# Patient Record
Sex: Female | Born: 1940 | Race: White | Hispanic: No | Smoking: Former smoker
Health system: Southern US, Community
[De-identification: ages and names within clinical notes are randomized; demographics above are authoritative.]

## PROBLEM LIST (undated history)

## (undated) DIAGNOSIS — T7840XA Allergy, unspecified, initial encounter: Secondary | ICD-10-CM

## (undated) DIAGNOSIS — H269 Unspecified cataract: Secondary | ICD-10-CM

## (undated) HISTORY — PX: ABDOMINAL HYSTERECTOMY: SHX81

## (undated) HISTORY — DX: Unspecified cataract: H26.9

## (undated) HISTORY — PX: APPENDECTOMY: SHX54

## (undated) HISTORY — PX: EYE SURGERY: SHX253

## (undated) HISTORY — PX: DILATION AND CURETTAGE OF UTERUS: SHX78

## (undated) HISTORY — DX: Allergy, unspecified, initial encounter: T78.40XA

---

## 1967-12-21 HISTORY — PX: OVARIAN CYST REMOVAL: SHX89

## 1983-12-21 HISTORY — PX: SIGMOIDOSCOPY: SUR1295

## 2003-07-02 LAB — HM PAP SMEAR: HM Pap smear: NEGATIVE

## 2005-12-28 ENCOUNTER — Ambulatory Visit: Payer: Self-pay | Admitting: Family Medicine

## 2007-01-11 ENCOUNTER — Ambulatory Visit: Payer: Self-pay | Admitting: Family Medicine

## 2007-11-29 ENCOUNTER — Encounter: Payer: Self-pay | Admitting: General Practice

## 2008-01-24 ENCOUNTER — Ambulatory Visit: Payer: Self-pay | Admitting: Family Medicine

## 2008-02-12 ENCOUNTER — Ambulatory Visit: Payer: Self-pay | Admitting: General Surgery

## 2008-07-02 ENCOUNTER — Other Ambulatory Visit: Payer: Self-pay

## 2008-07-02 ENCOUNTER — Emergency Department: Payer: Self-pay

## 2011-02-19 ENCOUNTER — Ambulatory Visit: Payer: Self-pay | Admitting: General Surgery

## 2011-02-19 LAB — HM COLONOSCOPY

## 2013-07-05 ENCOUNTER — Ambulatory Visit: Payer: Self-pay | Admitting: Family Medicine

## 2013-07-05 ENCOUNTER — Encounter: Payer: Self-pay | Admitting: *Deleted

## 2013-07-05 LAB — HM DEXA SCAN

## 2013-07-18 ENCOUNTER — Ambulatory Visit: Payer: Self-pay | Admitting: Ophthalmology

## 2013-09-12 ENCOUNTER — Ambulatory Visit: Payer: Self-pay | Admitting: Ophthalmology

## 2014-01-28 ENCOUNTER — Ambulatory Visit: Payer: Self-pay | Admitting: Unknown Physician Specialty

## 2014-08-30 LAB — CBC AND DIFFERENTIAL
HEMATOCRIT: 34 % — AB (ref 36–46)
HEMOGLOBIN: 11.3 g/dL — AB (ref 12.0–16.0)
PLATELETS: 278 10*3/uL (ref 150–399)
WBC: 7 10^3/mL

## 2014-08-30 LAB — BASIC METABOLIC PANEL
BUN: 16 mg/dL (ref 4–21)
Creatinine: 0.8 mg/dL (ref 0.5–1.1)
Glucose: 95 mg/dL
Potassium: 4.6 mmol/L (ref 3.4–5.3)
Sodium: 136 mmol/L — AB (ref 137–147)

## 2014-08-30 LAB — LIPID PANEL
Cholesterol: 242 mg/dL — AB (ref 0–200)
HDL: 72 mg/dL — AB (ref 35–70)
LDL CALC: 137 mg/dL
TRIGLYCERIDES: 164 mg/dL — AB (ref 40–160)

## 2014-08-30 LAB — HEPATIC FUNCTION PANEL
ALT: 8 U/L (ref 7–35)
AST: 12 U/L — AB (ref 13–35)

## 2014-08-30 LAB — TSH: TSH: 2.64 u[IU]/mL (ref 0.41–5.90)

## 2014-10-14 ENCOUNTER — Ambulatory Visit: Payer: Self-pay | Admitting: Family Medicine

## 2014-10-24 ENCOUNTER — Ambulatory Visit: Payer: Self-pay | Admitting: Family Medicine

## 2014-10-24 LAB — HM MAMMOGRAPHY

## 2014-11-04 ENCOUNTER — Ambulatory Visit: Payer: Self-pay | Admitting: Family Medicine

## 2014-11-04 HISTORY — PX: BREAST CYST ASPIRATION: SHX578

## 2014-11-04 HISTORY — PX: BREAST BIOPSY: SHX20

## 2015-04-14 LAB — SURGICAL PATHOLOGY

## 2015-08-19 ENCOUNTER — Other Ambulatory Visit: Payer: Self-pay | Admitting: Family Medicine

## 2015-08-19 ENCOUNTER — Telehealth: Payer: Self-pay | Admitting: Family Medicine

## 2015-08-19 DIAGNOSIS — Z780 Asymptomatic menopausal state: Secondary | ICD-10-CM | POA: Insufficient documentation

## 2015-08-19 NOTE — Telephone Encounter (Signed)
Pt stated that her thumb nail on her left hand has split and is tender & gray in color. Pt stated that it isn't red or swollen. Pt said that she has always had a line on her nail and she had acrylic put on top of her nail and when it was removed her nail had split 3/4 of the way down and had turned gray. Pt stated the nail tech said it was water trapped in between her nail & nail bed. Pt stated she doesn't want to come here an pay a co-pay if we are going to send her somewhere else. Pt wanted to know where she should go to have this treated. Please advise. Thanks TNP

## 2015-08-19 NOTE — Telephone Encounter (Signed)
Patient reports that she will call her dermatologist, pt reports that she does not need a referral. sd

## 2015-08-19 NOTE — Telephone Encounter (Signed)
Sounds like she would need a Dermatologist. Would be glad to refer to Dermatology to evaluate. Thanks.

## 2015-09-26 DIAGNOSIS — D649 Anemia, unspecified: Secondary | ICD-10-CM | POA: Insufficient documentation

## 2015-09-26 DIAGNOSIS — E559 Vitamin D deficiency, unspecified: Secondary | ICD-10-CM | POA: Insufficient documentation

## 2015-09-26 DIAGNOSIS — Z6825 Body mass index (BMI) 25.0-25.9, adult: Secondary | ICD-10-CM | POA: Insufficient documentation

## 2015-09-26 DIAGNOSIS — M8580 Other specified disorders of bone density and structure, unspecified site: Secondary | ICD-10-CM | POA: Insufficient documentation

## 2015-09-26 DIAGNOSIS — L719 Rosacea, unspecified: Secondary | ICD-10-CM | POA: Insufficient documentation

## 2015-09-26 DIAGNOSIS — E7800 Pure hypercholesterolemia, unspecified: Secondary | ICD-10-CM | POA: Insufficient documentation

## 2015-09-26 DIAGNOSIS — N951 Menopausal and female climacteric states: Secondary | ICD-10-CM | POA: Insufficient documentation

## 2015-09-26 DIAGNOSIS — K5790 Diverticulosis of intestine, part unspecified, without perforation or abscess without bleeding: Secondary | ICD-10-CM | POA: Insufficient documentation

## 2015-09-26 DIAGNOSIS — E663 Overweight: Secondary | ICD-10-CM | POA: Insufficient documentation

## 2015-09-26 DIAGNOSIS — J309 Allergic rhinitis, unspecified: Secondary | ICD-10-CM | POA: Insufficient documentation

## 2015-09-30 ENCOUNTER — Ambulatory Visit (INDEPENDENT_AMBULATORY_CARE_PROVIDER_SITE_OTHER): Payer: Medicare Other | Admitting: Family Medicine

## 2015-09-30 ENCOUNTER — Encounter: Payer: Self-pay | Admitting: Family Medicine

## 2015-09-30 VITALS — BP 114/80 | HR 84 | Temp 97.8°F | Resp 16 | Ht 65.0 in | Wt 146.0 lb

## 2015-09-30 DIAGNOSIS — M810 Age-related osteoporosis without current pathological fracture: Secondary | ICD-10-CM | POA: Diagnosis not present

## 2015-09-30 DIAGNOSIS — Z1239 Encounter for other screening for malignant neoplasm of breast: Secondary | ICD-10-CM

## 2015-09-30 DIAGNOSIS — Z0000 Encounter for general adult medical examination without abnormal findings: Secondary | ICD-10-CM | POA: Diagnosis not present

## 2015-09-30 DIAGNOSIS — E7800 Pure hypercholesterolemia, unspecified: Secondary | ICD-10-CM | POA: Diagnosis not present

## 2015-09-30 DIAGNOSIS — N951 Menopausal and female climacteric states: Secondary | ICD-10-CM

## 2015-09-30 DIAGNOSIS — D508 Other iron deficiency anemias: Secondary | ICD-10-CM

## 2015-09-30 DIAGNOSIS — Z780 Asymptomatic menopausal state: Secondary | ICD-10-CM

## 2015-09-30 MED ORDER — ESTRADIOL 0.5 MG PO TABS: 0.5000 mg | ORAL_TABLET | Freq: Every day | ORAL | Status: DC

## 2015-09-30 NOTE — Patient Instructions (Signed)
Please call the [facility] at [facility] to schedule this at [phone]   

## 2015-09-30 NOTE — Progress Notes (Signed)
Patient ID: Carol Kelly, female   DOB: 1941-11-26, 74 y.o.   MRN: [id number]        Patient: Carol Kelly, Female    DOB: October 04, 1941, 74 y.o.   MRN: [id number] Visit Date: 09/30/2015  Today's Provider: Lorie Phenix, MD   Chief Complaint  Patient presents with  . Medicare Wellness   Subjective:    Annual wellness visit Carol Kelly is a 74 y.o. female. She feels well. She reports exercising none. She reports she is sleeping fairly well. Has no concerns other than her thumb,  having biopsy at the end of next week.    08/30/14 CPE 07/02/03 Pap-neg 10/24/14 Mammo-BI-RADS 2 02/19/11 Colon-Diverticulosis 07/05/13 BMD-osteopenia  Lab Results  Component Value Date   WBC 7.0 08/30/2014   HGB 11.3* 08/30/2014   HCT 34* 08/30/2014   PLT 278 08/30/2014   CHOL 242* 08/30/2014   TRIG 164* 08/30/2014   HDL 72* 08/30/2014   LDLCALC 137 08/30/2014   ALT 8 08/30/2014   AST 12* 08/30/2014   NA 136* 08/30/2014   K 4.6 08/30/2014   CREATININE 0.8 08/30/2014   BUN 16 08/30/2014   TSH 2.64 08/30/2014    -----------------------------------------------------------   Review of Systems  Constitutional: Negative.   HENT: Negative.   Eyes: Negative.   Respiratory: Negative.   Cardiovascular: Negative.   Gastrointestinal: Negative.   Endocrine: Negative.   Genitourinary: Negative.   Musculoskeletal: Negative.   Skin: Negative.   Allergic/Immunologic: Negative.   Neurological: Negative.   Hematological: Negative.   Psychiatric/Behavioral: Negative.     Social History   Social History  . Marital Status: Single    Spouse Name: N/A  . Number of Children: N/A  . Years of Education: N/A   Occupational History  . Not on file.   Social History Main Topics  . Smoking status: Former Games developer  . Smokeless tobacco: Never Used  . Alcohol Use: Yes     Comment: occasional  . Drug Use: No  . Sexual Activity: Not on file   Other Topics Concern  . Not on file    Social History Narrative    Patient Active Problem List   Diagnosis Date Noted  . Allergic rhinitis 09/26/2015  . Absolute anemia 09/26/2015  . Body mass index (BMI) of 25.0-25.9 in adult 09/26/2015  . DD (diverticular disease) 09/26/2015  . Hot flash, menopausal 09/26/2015  . Hypercholesteremia 09/26/2015  . OP (osteoporosis) 09/26/2015  . Acne erythematosa 09/26/2015  . Avitaminosis D 09/26/2015  . Post-menopausal 08/19/2015    Past Surgical History  Procedure Laterality Date  . Ovarian cyst removal  1969  . Sigmoidoscopy  1985  . Abdominal hysterectomy    . Appendectomy    . Dilation and curettage of uterus    . Eye surgery Bilateral  left9/24/14    Cataract removed Right 07/18/2013    Her family history includes Brain cancer in her brother; Colon polyps in her father; Dementia in her father; Diabetes in her mother; Heart disease in her brother and father.    Previous Medications   AZELASTINE HCL 0.15 % SOLN       ESTRADIOL (ESTRACE) 0.5 MG TABLET    TAKE ONE TABLET BY MOUTH DAILY   MULTIPLE VITAMINS-MINERALS PO    Take 1 tablet by mouth daily.    Patient Care Team: Lorie Phenix, MD as PCP - General (Family Medicine)     Objective:   Vitals: BP 114/80 mmHg  Pulse 84  Temp(Src) 97.8  F (36.6 C) (Oral)  Resp 16  Ht  (1.651 m)  Wt 146 lb (66.225 kg)  BMI 24.30 kg/m2  SpO2 98%  Physical Exam  Constitutional: She is oriented to person, place, and time. She appears well-developed and well-nourished.  HENT:  Head: Normocephalic and atraumatic.  Right Ear: Tympanic membrane, external ear and ear canal normal.  Left Ear: Tympanic membrane, external ear and ear canal normal.  Nose: Nose normal.  Mouth/Throat: Uvula is midline, oropharynx is clear and moist and mucous membranes are normal.  Eyes: Conjunctivae, EOM and lids are normal. Pupils are equal, round, and reactive to light.  Neck: Trachea normal and normal range of motion. Neck supple. Carotid  bruit is not present. No thyroid mass and no thyromegaly present.  Cardiovascular: Normal rate, regular rhythm and normal heart sounds.   Pulmonary/Chest: Effort normal and breath sounds normal.  Abdominal: Soft. Normal appearance and bowel sounds are normal. There is no hepatosplenomegaly. There is no tenderness.  Genitourinary: No breast swelling, tenderness or discharge.  Musculoskeletal: Normal range of motion.  Lymphadenopathy:    She has no cervical adenopathy.    She has no axillary adenopathy.  Neurological: She is alert and oriented to person, place, and time. She has normal strength. No cranial nerve deficit.  Skin: Skin is warm, dry and intact.  Psychiatric: She has a normal mood and affect. Her speech is normal and behavior is normal. Judgment and thought content normal. Cognition and memory are normal.    Activities of Daily Living In your present state of health, do you have any difficulty performing the following activities: 09/30/2015  Hearing? N  Vision? N  Difficulty concentrating or making decisions? N  Walking or climbing stairs? N  Dressing or bathing? N  Doing errands, shopping? N    Fall Risk Assessment Fall Risk  09/30/2015  Falls in the past year? No     Depression Screen PHQ 2/9 Scores 09/30/2015  PHQ - 2 Score 0    Cognitive Testing - 6-CIT  Correct? Score   What year is it? yes 0 0 or 4  What month is it? yes 0 0 or 3  Memorize:    Floyde Parkins,  43,  [address]  [place],      What time is it? (within 1 hour) yes 0 0 or 3  Count backwards from 20 yes 0 0, 2, or 4  Name the months of the year yes 0 0, 2, or 4  Repeat name & address above yes 0 0, 2, 4, 6, 8, or 10       TOTAL SCORE  0/28   Interpretation:  Normal  Normal (0-7) Abnormal (8-28)       Assessment & Plan:     Annual Wellness Visit  Reviewed patient's Family Medical History Reviewed and updated list of patient's medical providers Assessment of cognitive impairment was  done Assessed patient's functional ability Established a written schedule for health screening services Health Risk Assessent Completed and Reviewed  Exercise Activities and Dietary recommendations Goals    . Exercise 150 minutes per week (moderate activity)       Immunization History  Administered Date(s) Administered  . Pneumococcal Conjugate-13 08/30/2014  . Pneumococcal Polysaccharide-23 06/27/2014  . Zoster 10/27/2011    Health Maintenance  Topic Date Due  . Janet Berlin  08/26/1961  . INFLUENZA VACCINE  07/21/2015  . MAMMOGRAM  10/24/2016  . COLONOSCOPY  02/18/2021  . DEXA SCAN  Completed  .  ZOSTAVAX  Completed  . PNA vac Low Risk Adult  Completed      1. Medicare annual wellness visit, subsequent Stable. Patient advised to continue eating healthy and exercise daily.  2. Breast cancer screening - MM DIGITAL SCREENING BILATERAL; Future  3. Hypercholesteremia - Comprehensive metabolic panel - Lipid Panel With LDL/HDL Ratio  4. Other iron deficiency anemias - CBC with Differential/Platelet  5. OP (osteoporosis) - TSH - DG Bone Density; Future     Patient seen and examined by Dr. Leo Grosser, and note scribed by Liz Beach. Dimas, CMA.  I have reviewed the document for accuracy and completeness and I agree with above. Leo Grosser, MD   Lorie Phenix, MD       ------------------------------------------------------------------------------------------------------------

## 2015-10-01 LAB — COMPREHENSIVE METABOLIC PANEL
A/G RATIO: 1.7 (ref 1.1–2.5)
ALK PHOS: 77 IU/L (ref 39–117)
ALT: 7 IU/L (ref 0–32)
AST: 17 IU/L (ref 0–40)
Albumin: 4.5 g/dL (ref 3.5–4.8)
BILIRUBIN TOTAL: 0.4 mg/dL (ref 0.0–1.2)
BUN/Creatinine Ratio: 18 (ref 11–26)
BUN: 14 mg/dL (ref 8–27)
CO2: 22 mmol/L (ref 18–29)
Calcium: 10 mg/dL (ref 8.7–10.3)
Chloride: 100 mmol/L (ref 97–108)
Creatinine, Ser: 0.78 mg/dL (ref 0.57–1.00)
GFR calc non Af Amer: 76 mL/min/{1.73_m2} (ref >59)
GFR, EST AFRICAN AMERICAN: 87 mL/min/{1.73_m2} (ref >59)
GLUCOSE: 108 mg/dL — AB (ref 65–99)
Globulin, Total: 2.6 g/dL (ref 1.5–4.5)
POTASSIUM: 5.7 mmol/L — AB (ref 3.5–5.2)
SODIUM: 142 mmol/L (ref 134–144)
Total Protein: 7.1 g/dL (ref 6.0–8.5)

## 2015-10-01 LAB — CBC WITH DIFFERENTIAL/PLATELET
BASOS ABS: 0.1 10*3/uL (ref 0.0–0.2)
BASOS: 2 %
EOS (ABSOLUTE): 0.8 10*3/uL — AB (ref 0.0–0.4)
Eos: 12 %
Hematocrit: 36.7 % (ref 34.0–46.6)
Hemoglobin: 12.2 g/dL (ref 11.1–15.9)
Immature Grans (Abs): 0 10*3/uL (ref 0.0–0.1)
Immature Granulocytes: 0 %
Lymphocytes Absolute: 2.6 10*3/uL (ref 0.7–3.1)
Lymphs: 39 %
MCH: 31.9 pg (ref 26.6–33.0)
MCHC: 33.2 g/dL (ref 31.5–35.7)
MCV: 96 fL (ref 79–97)
MONOS ABS: 0.5 10*3/uL (ref 0.1–0.9)
Monocytes: 8 %
NEUTROS ABS: 2.5 10*3/uL (ref 1.4–7.0)
NEUTROS PCT: 39 %
PLATELETS: 260 10*3/uL (ref 150–379)
RBC: 3.82 x10E6/uL (ref 3.77–5.28)
RDW: 12.8 % (ref 12.3–15.4)
WBC: 6.4 10*3/uL (ref 3.4–10.8)

## 2015-10-01 LAB — LIPID PANEL WITH LDL/HDL RATIO
Cholesterol, Total: 248 mg/dL — ABNORMAL HIGH (ref 100–199)
HDL: 79 mg/dL (ref >39)
LDL CALC: 136 mg/dL — AB (ref 0–99)
LDL/HDL RATIO: 1.7 ratio (ref 0.0–3.2)
Triglycerides: 164 mg/dL — ABNORMAL HIGH (ref 0–149)
VLDL Cholesterol Cal: 33 mg/dL (ref 5–40)

## 2015-10-01 LAB — TSH: TSH: 3.2 u[IU]/mL (ref 0.450–4.500)

## 2015-10-02 ENCOUNTER — Telehealth: Payer: Self-pay

## 2015-10-02 DIAGNOSIS — E875 Hyperkalemia: Secondary | ICD-10-CM

## 2015-10-02 NOTE — Telephone Encounter (Signed)
Pt advised; she does not want to start a statin right now.  She will continue to work on lifestyle changes.  Also pt did not get her labs checked downstairs; she went to a different [facility].  But she agreed to get her potassium recheck one day late next week.  Will leave a sheet at the front desk.    Thanks,   -Vernona RiegerLaura

## 2015-10-02 NOTE — Telephone Encounter (Signed)
-----  Message from Margarita Rana, MD sent at 10/01/2015  2:03 PM EDT ----- Cholesterol about the same as last year. Can take a statin or continue lifestyle, know that patient did not want a medication last year and labs have not really changed. 10 year risk of cardiac event is 13 percent, but risk just based on age is 55 percent, so not sure of benefit of medication. Also, potassium is elevated as is sure.  Unclear if lab error.  Have been having trouble with this.   Recommend recheck at different lab. Print out met b lab slip and make sure to right on it, no charge per Gordy Clement and give copy to Judson Roch. Thanks.

## 2015-10-06 ENCOUNTER — Telehealth: Payer: Self-pay | Admitting: Family Medicine

## 2015-10-06 DIAGNOSIS — N6310 Unspecified lump in the right breast, unspecified quadrant: Secondary | ICD-10-CM

## 2015-10-06 NOTE — Telephone Encounter (Signed)
[facility] due for f/u of right breast mass and also yearly [facility] and limited ultrasound of right/left breast

## 2015-10-06 NOTE — Telephone Encounter (Signed)
Ok to order. Thanks.   

## 2015-10-15 ENCOUNTER — Ambulatory Visit: Payer: Self-pay

## 2015-10-15 ENCOUNTER — Telehealth: Payer: Self-pay

## 2015-10-15 LAB — BASIC METABOLIC PANEL
BUN/Creatinine Ratio: 16 (ref 11–26)
BUN: 12 mg/dL (ref 8–27)
CALCIUM: 9.4 mg/dL (ref 8.7–10.3)
CHLORIDE: 98 mmol/L (ref 97–106)
CO2: 27 mmol/L (ref 18–29)
Creatinine, Ser: 0.75 mg/dL (ref 0.57–1.00)
GFR, EST AFRICAN AMERICAN: 91 mL/min/{1.73_m2} (ref >59)
GFR, EST NON AFRICAN AMERICAN: 79 mL/min/{1.73_m2} (ref >59)
Glucose: 100 mg/dL — ABNORMAL HIGH (ref 65–99)
POTASSIUM: 4.2 mmol/L (ref 3.5–5.2)
SODIUM: 138 mmol/L (ref 136–144)

## 2015-10-15 NOTE — Telephone Encounter (Signed)
Pt advised.   Thanks,   -Laura  

## 2015-10-15 NOTE — Telephone Encounter (Signed)
-----   Message from Lorie PhenixNancy Maloney, MD sent at 10/15/2015 10:05 AM EDT ----- Normal labs.  Please notify patient. Thanks.

## 2015-10-16 ENCOUNTER — Telehealth: Payer: Self-pay

## 2015-10-16 NOTE — Telephone Encounter (Signed)
-----   Message from Lorie PhenixNancy Maloney, MD sent at 10/15/2015  4:42 PM EDT ----- Labs stable. Please notify patient. Thanks.

## 2015-10-16 NOTE — Telephone Encounter (Signed)
Pt advised.   Thanks,   -Shya Kovatch  

## 2015-10-20 ENCOUNTER — Ambulatory Visit: Admission: RE | Admit: 2015-10-20 | Payer: Medicare Other | Source: Ambulatory Visit

## 2015-10-20 ENCOUNTER — Ambulatory Visit
Admission: RE | Admit: 2015-10-20 | Discharge: 2015-10-20 | Disposition: A | Discharge status: Home / Self Care | Payer: Medicare Other | Source: Ambulatory Visit | Attending: Family Medicine | Admitting: Family Medicine

## 2015-10-20 DIAGNOSIS — M8580 Other specified disorders of bone density and structure, unspecified site: Secondary | ICD-10-CM | POA: Insufficient documentation

## 2015-10-20 DIAGNOSIS — M810 Age-related osteoporosis without current pathological fracture: Secondary | ICD-10-CM

## 2015-10-20 DIAGNOSIS — Z1239 Encounter for other screening for malignant neoplasm of breast: Secondary | ICD-10-CM

## 2015-10-20 DIAGNOSIS — R928 Other abnormal and inconclusive findings on diagnostic imaging of breast: Secondary | ICD-10-CM | POA: Diagnosis present

## 2015-10-20 DIAGNOSIS — Z13820 Encounter for screening for osteoporosis: Secondary | ICD-10-CM | POA: Diagnosis not present

## 2015-11-03 ENCOUNTER — Telehealth: Payer: Self-pay

## 2015-11-03 NOTE — Telephone Encounter (Signed)
Pt declines treatment. Allene DillonEmily Drozdowski, CMA

## 2015-11-03 NOTE — Telephone Encounter (Signed)
-----   Message from Lorie PhenixNancy Maloney, MD sent at 11/02/2015  2:59 PM EST ----- Still with osteoporosis. Not changed from 2014. 10 year risk of hip fracture is 9.4 percent and any fracture is 21 percent. Please see if patient would like to come in and discuss medical management. Thanks- Dr. Judie PetitM.

## 2016-02-23 ENCOUNTER — Ambulatory Visit: Payer: Self-pay | Admitting: General Surgery

## 2016-02-25 ENCOUNTER — Ambulatory Visit: Payer: Self-pay | Admitting: General Surgery

## 2016-03-02 ENCOUNTER — Ambulatory Visit: Payer: Self-pay | Admitting: General Surgery

## 2016-05-26 ENCOUNTER — Other Ambulatory Visit: Payer: Self-pay

## 2016-05-26 DIAGNOSIS — Z780 Asymptomatic menopausal state: Secondary | ICD-10-CM

## 2016-05-26 MED ORDER — ESTRADIOL 0.5 MG PO TABS: 0.5000 mg | ORAL_TABLET | Freq: Every day | ORAL | Status: DC

## 2016-07-14 ENCOUNTER — Encounter: Payer: Self-pay | Admitting: *Deleted

## 2016-08-31 ENCOUNTER — Encounter: Payer: Self-pay | Admitting: *Deleted

## 2016-09-06 ENCOUNTER — Ambulatory Visit: Payer: Self-pay | Admitting: General Surgery

## 2016-10-01 ENCOUNTER — Encounter: Payer: Self-pay | Admitting: Physician Assistant

## 2016-10-01 ENCOUNTER — Ambulatory Visit (INDEPENDENT_AMBULATORY_CARE_PROVIDER_SITE_OTHER): Payer: Medicare Other | Admitting: Physician Assistant

## 2016-10-01 VITALS — BP 124/58 | HR 80 | Temp 97.5°F | Ht 64.75 in | Wt 159.1 lb

## 2016-10-01 DIAGNOSIS — E7800 Pure hypercholesterolemia, unspecified: Secondary | ICD-10-CM

## 2016-10-01 DIAGNOSIS — Z0000 Encounter for general adult medical examination without abnormal findings: Secondary | ICD-10-CM

## 2016-10-01 DIAGNOSIS — D649 Anemia, unspecified: Secondary | ICD-10-CM

## 2016-10-01 DIAGNOSIS — Z1239 Encounter for other screening for malignant neoplasm of breast: Secondary | ICD-10-CM

## 2016-10-01 DIAGNOSIS — Z1211 Encounter for screening for malignant neoplasm of colon: Secondary | ICD-10-CM

## 2016-10-01 NOTE — Progress Notes (Signed)
HPI: Carol Kelly is a 75 year old female that comes in today for her subsequent annual at a care wellness and complete physical exam. Annual wellness visit was completed and is documented under clinical support no from today.  Patient states that she is doing well and has no complaints today. She is excited because she is planning to go on a ten-day beach trip with 7 other females.  Review of Systems  Constitutional: Negative.   HENT: Negative.   Eyes: Positive for photophobia (chronic).  Respiratory: Negative.   Cardiovascular: Negative.   Gastrointestinal: Negative.   Endocrine: Negative.   Genitourinary: Negative.   Musculoskeletal: Negative.   Skin: Negative.   Allergic/Immunologic: Negative.   Neurological: Negative.   Hematological: Negative.   Psychiatric/Behavioral: Negative.    BP: 124/58 P:80 T:97.5 Ht: 5'4" Wt: 159 pounds BMI: 26.68 kg/m2  Physical Exam  Constitutional: She is oriented to person, place, and time. She appears well-developed and well-nourished. No distress.  HENT:  Head: Normocephalic and atraumatic.  Right Ear: Hearing, tympanic membrane, external ear and ear canal normal.  Left Ear: Hearing, tympanic membrane, external ear and ear canal normal.  Nose: Nose normal.  Mouth/Throat: Uvula is midline, oropharynx is clear and moist and mucous membranes are normal. No oropharyngeal exudate.  Eyes: Conjunctivae and EOM are normal. Pupils are equal, round, and reactive to light. Right eye exhibits no discharge. Left eye exhibits no discharge. No scleral icterus.  Neck: Normal range of motion. Neck supple. No JVD present. Carotid bruit is not present. No tracheal deviation present. No thyromegaly present.  Cardiovascular: Normal rate, regular rhythm, normal heart sounds and intact distal pulses.  Exam reveals no gallop and no friction rub.   No murmur heard. Pulmonary/Chest: Effort normal and breath sounds normal. No respiratory distress. She has no wheezes.  She has no rales. She exhibits no tenderness.  Abdominal: Soft. Bowel sounds are normal. She exhibits no distension and no mass. There is no tenderness. There is no rebound and no guarding.  Musculoskeletal: Normal range of motion. She exhibits no edema or tenderness.  Lymphadenopathy:    She has no cervical adenopathy.  Neurological: She is alert and oriented to person, place, and time.  Skin: Skin is warm and dry. No rash noted. She is not diaphoretic.  Psychiatric: She has a normal mood and affect. Her behavior is normal. Judgment and thought content normal.  Vitals reviewed.  Assessment   1. Annual physical exam   2. Colon cancer screening   3. Breast cancer screening   4. Hypercholesteremia   5. Anemia, unspecified type    Plan  1. Annual physical exam Normal physical exam today.  2. Colon cancer screening Patient prefers to cologuard over colonoscopy. Will order cologuard as below and follow-up pending results. - Cologuard  3. Breast cancer screening Breast exam today was normal. There is no family history of breast cancer. She does perform regular self breast exams. Mammogram was ordered as below. Information for [facility] was given to patient so she may schedule her mammogram at her convenience. - MM Digital Screening; Future  4. Hypercholesteremia  patient has a history of high cholesterol but is willing to start any cholesterol-lowering medication due to potential side effects. Cholesterol over the last few years has been stable. She is continuing lifestyle modifications including diet and exercise. I will check labs as below and follow-up pending lab results. - Lipid Profile - Comprehensive Metabolic Panel (CMET)  5. Anemia, unspecified type Stable. Will check labs  as below and f/u pending results. - CBC w/Diff/Platelet - Comprehensive Metabolic Panel (CMET) - TSH

## 2016-10-01 NOTE — Progress Notes (Signed)
Subjective:   Carol Kelly is a 75 y.o. female who presents for Medicare Annual (Subsequent) preventive examination.  Review of Systems:  N/A Cardiac Risk Factors include: advanced age (>67men, >35 women);dyslipidemia     Objective:     Vitals: BP (!) 124/58   Pulse 80   Temp 97.5 F (36.4 C)   Ht 5' 4.75" (1.645 m)   Wt 159 lb 2 oz (72.2 kg)   BMI 26.68 kg/m   Body mass index is 26.68 kg/m.   Tobacco History  Smoking Status  . Former Smoker  Smokeless Tobacco  . Never Used     Counseling given: No   Past Medical History:  Diagnosis Date  . Allergy    Past Surgical History:  Procedure Laterality Date  . ABDOMINAL HYSTERECTOMY    . APPENDECTOMY    . BREAST BIOPSY Right 2015   neg  . BREAST CYST ASPIRATION Right neg   2015  . DILATION AND CURETTAGE OF UTERUS    . EYE SURGERY Bilateral  left9/24/14   Cataract removed Right 07/18/2013  . OVARIAN CYST REMOVAL  1969  . SIGMOIDOSCOPY  1985   Family History  Problem Relation Age of Onset  . Colon polyps Father   . Heart disease Father     CVD  . Dementia Father   . Diabetes Mother   . Heart disease Brother   . Brain cancer Brother    History  Sexual Activity  . Sexual activity: Yes    Outpatient Encounter Prescriptions as of 10/01/2016  Medication Sig  . Calcium Carb-Cholecalciferol (CALCIUM 1000 + D) 1000-800 MG-UNIT TABS Take by mouth.  . cetirizine (ZYRTEC) 10 MG tablet Take 10 mg by mouth daily.  . cycloSPORINE (RESTASIS) 0.05 % ophthalmic emulsion Place 1 drop into both eyes 2 (two) times daily.  Marland Kitchen estradiol (ESTRACE) 0.5 MG tablet Take 1 tablet (0.5 mg total) by mouth daily. (Patient taking differently: Take 0.15 mg by mouth daily. )  . Multiple Vitamins-Calcium (VIACTIV MULTI-VITAMIN) CHEW Chew by mouth daily.  . Multiple Vitamins-Minerals (PRESERVISION AREDS 2 PO) Take by mouth.  . Azelastine HCl 0.15 % SOLN   . MULTIPLE VITAMINS-MINERALS PO Take 1 tablet by mouth daily.   No  facility-administered encounter medications on file as of 10/01/2016.     Activities of Daily Living In your present state of health, do you have any difficulty performing the following activities: 10/01/2016  Hearing? N  Vision? N  Difficulty concentrating or making decisions? N  Walking or climbing stairs? N  Dressing or bathing? N  Doing errands, shopping? N  Preparing Food and eating ? N  Using the Toilet? N  In the past six months, have you accidently leaked urine? Y  Do you have problems with loss of bowel control? N  Managing your Medications? N  Managing your Finances? N  Housekeeping or managing your Housekeeping? N  Some recent data might be hidden    Patient Care Team: Margaretann Loveless, PA-C as PCP - General (Family Medicine)    Assessment:      Exercise Activities and Dietary recommendations Current Exercise Habits: Home exercise routine, Type of exercise: stretching;strength training/weights, Time (Minutes): 60, Frequency (Times/Week): 5, Weekly Exercise (Minutes/Week): 300, Intensity: Mild, Exercise limited by: None identified  Goals    . Exercise 150 minutes per week (moderate activity)    . Reduce portion size          Starting 10/01/16, I will cut back on my  portion sizes and fatty foods.      Fall Risk Fall Risk  10/01/2016 09/30/2015  Falls in the past year? No No   Depression Screen PHQ 2/9 Scores 10/01/2016 09/30/2015  PHQ - 2 Score 0 0     Cognitive Testing No flowsheet data found.   6CIT Screen 10/01/2016  What Year? 0 points  What month? 0 points  What time? 0 points  Count back from 20 0 points  Months in reverse 0 points  Repeat phrase 0 points  Total Score 0     Immunization History  Administered Date(s) Administered  . Influenza-Unspecified 09/15/2016  . Pneumococcal Conjugate-13 08/30/2014  . Pneumococcal Polysaccharide-23 06/27/2014  . Zoster 10/27/2011   Screening Tests Health Maintenance  Topic Date Due  .  TETANUS/TDAP  12/29/2021 (Originally 08/26/1960)  . MAMMOGRAM  10/19/2017  . COLONOSCOPY  02/18/2021  . INFLUENZA VACCINE  Completed  . DEXA SCAN  Completed  . ZOSTAVAX  Completed  . PNA vac Low Risk Adult  Completed      Plan:    I have personally reviewed and addressed the Medicare Annual Wellness questionnaire and have noted the following in the patient's chart:  A. Medical and social history B. Use of alcohol, tobacco or illicit drugs  C. Current medications and supplements D. Functional ability and status E.  Nutritional status F.  Physical activity G. Advance directives H. List of other physicians I.  Hospitalizations, surgeries, and ER visits in previous 12 months J.  Vitals K. Screenings such as hearing and vision if needed, cognitive and depression L. Referrals and appointments - none  In addition, I have reviewed and discussed with patient certain preventive protocols, quality metrics, and best practice recommendations. A written personalized care plan for preventive services as well as general preventive health recommendations were provided to patient.  See attached scanned questionnaire for additional information.   Signed,  Hyacinth MeekerMckenzie Wilmarie Sparlin, LPN Nurse Health Advisor

## 2016-10-01 NOTE — Patient Instructions (Signed)

## 2016-10-01 NOTE — Patient Instructions (Signed)
Carol Kelly , Thank you for taking time to come for your Medicare Wellness Visit. I appreciate your ongoing commitment to your health goals. Please review the following plan we discussed and let me know if I can assist you in the future.   These are the goals we discussed: Goals    . Exercise 150 minutes per week (moderate activity)    . Reduce portion size          Starting 10/01/16, I will cut back on my portion sizes and fatty foods.       This is a list of the screening recommended for you and due dates:  Health Maintenance  Topic Date Due  . Tetanus Vaccine  12/29/2021*  . Mammogram  10/19/2017  . Colon Cancer Screening  02/18/2021  . Flu Shot  Completed  . DEXA scan (bone density measurement)  Completed  . Shingles Vaccine  Completed  . Pneumonia vaccines  Completed  *Topic was postponed. The date shown is not the original due date.   Preventive Care for Adults  A healthy lifestyle and preventive care can promote health and wellness. Preventive health guidelines for adults include the following key practices.  . A routine yearly physical is a good way to check with your health care provider about your health and preventive screening. It is a chance to share any concerns and updates on your health and to receive a thorough exam.  . Visit your dentist for a routine exam and preventive care every 6 months. Brush your teeth twice a day and floss once a day. Good oral hygiene prevents tooth decay and gum disease.  . The frequency of eye exams is based on your age, health, family medical history, use  of contact lenses, and other factors. Follow your health care provider's ecommendations for frequency of eye exams.  . Eat a healthy diet. Foods like vegetables, fruits, whole grains, low-fat dairy products, and lean protein foods contain the nutrients you need without too many calories. Decrease your intake of foods high in solid fats, added sugars, and salt. Eat the right  amount of calories for you. Get information about a proper diet from your health care provider, if necessary.  . Regular physical exercise is one of the most important things you can do for your health. Most adults should get at least 150 minutes of moderate-intensity exercise (any activity that increases your heart rate and causes you to sweat) each week. In addition, most adults need muscle-strengthening exercises on 2 or more days a week.  Silver Sneakers may be a benefit available to you. To determine eligibility, you may visit the website: www.silversneakers.com or contact program at [phone] Mon-Fri between 8AM-8PM.   . Maintain a healthy weight. The body mass index (BMI) is a screening tool to identify possible weight problems. It provides an estimate of body fat based on height and weight. Your health care provider can find your BMI and can help you achieve or maintain a healthy weight.   For adults 20 years and older: ? A BMI below 18.5 is considered underweight. ? A BMI of 18.5 to 24.9 is normal. ? A BMI of 25 to 29.9 is considered overweight. ? A BMI of 30 and above is considered obese.   . Maintain normal blood lipids and cholesterol levels by exercising and minimizing your intake of saturated fat. Eat a balanced diet with plenty of fruit and vegetables. Blood tests for lipids and cholesterol should begin at age 74 and  be repeated every 5 years. If your lipid or cholesterol levels are high, you are over 50, or you are at high risk for heart disease, you may need your cholesterol levels checked more frequently. Ongoing high lipid and cholesterol levels should be treated with medicines if diet and exercise are not working.  . If you smoke, find out from your health care provider how to quit. If you do not use tobacco, please do not start.  . If you choose to drink alcohol, please do not consume more than 2 drinks per day. One drink is considered to be 12 ounces (355 mL) of beer, 5  ounces (148 mL) of wine, or 1.5 ounces (44 mL) of liquor.  . If you are 39-53 years old, ask your health care provider if you should take aspirin to prevent strokes.  . Use sunscreen. Apply sunscreen liberally and repeatedly throughout the day. You should seek shade when your shadow is shorter than you. Protect yourself by wearing long sleeves, pants, a wide-brimmed hat, and sunglasses year round, whenever you are outdoors.  . Once a month, do a whole body skin exam, using a mirror to look at the skin on your back. Tell your health care provider of new moles, moles that have irregular borders, moles that are larger than a pencil eraser, or moles that have changed in shape or color.

## 2016-10-05 ENCOUNTER — Telehealth: Payer: Self-pay | Admitting: Physician Assistant

## 2016-10-05 NOTE — Telephone Encounter (Signed)
Order for cologuard faxed to [facility] °

## 2016-10-12 LAB — COLOGUARD

## 2016-10-13 ENCOUNTER — Telehealth: Payer: Self-pay

## 2016-10-13 LAB — TSH: TSH: 4.13 u[IU]/mL (ref 0.450–4.500)

## 2016-10-13 LAB — CBC WITH DIFFERENTIAL/PLATELET
BASOS ABS: 0.1 10*3/uL (ref 0.0–0.2)
Basos: 1 %
EOS (ABSOLUTE): 0.7 10*3/uL — ABNORMAL HIGH (ref 0.0–0.4)
Eos: 9 %
Hematocrit: 36 % (ref 34.0–46.6)
Hemoglobin: 11.6 g/dL (ref 11.1–15.9)
IMMATURE GRANULOCYTES: 0 %
Immature Grans (Abs): 0 10*3/uL (ref 0.0–0.1)
LYMPHS ABS: 2.6 10*3/uL (ref 0.7–3.1)
Lymphs: 36 %
MCH: 31.2 pg (ref 26.6–33.0)
MCHC: 32.2 g/dL (ref 31.5–35.7)
MCV: 97 fL (ref 79–97)
MONOCYTES: 10 %
MONOS ABS: 0.7 10*3/uL (ref 0.1–0.9)
NEUTROS PCT: 44 %
Neutrophils Absolute: 3.1 10*3/uL (ref 1.4–7.0)
Platelets: 308 10*3/uL (ref 150–379)
RBC: 3.72 x10E6/uL — AB (ref 3.77–5.28)
RDW: 13.6 % (ref 12.3–15.4)
WBC: 7.1 10*3/uL (ref 3.4–10.8)

## 2016-10-13 LAB — COMPREHENSIVE METABOLIC PANEL
A/G RATIO: 1.6 (ref 1.2–2.2)
ALBUMIN: 4.3 g/dL (ref 3.5–4.8)
ALT: 10 IU/L (ref 0–32)
AST: 14 IU/L (ref 0–40)
Alkaline Phosphatase: 81 IU/L (ref 39–117)
BUN / CREAT RATIO: 18 (ref 12–28)
BUN: 15 mg/dL (ref 8–27)
Bilirubin Total: 0.4 mg/dL (ref 0.0–1.2)
CALCIUM: 9.9 mg/dL (ref 8.7–10.3)
CO2: 29 mmol/L (ref 18–29)
Chloride: 98 mmol/L (ref 96–106)
Creatinine, Ser: 0.85 mg/dL (ref 0.57–1.00)
GFR, EST AFRICAN AMERICAN: 78 mL/min/{1.73_m2} (ref >59)
GFR, EST NON AFRICAN AMERICAN: 68 mL/min/{1.73_m2} (ref >59)
GLOBULIN, TOTAL: 2.7 g/dL (ref 1.5–4.5)
Glucose: 98 mg/dL (ref 65–99)
POTASSIUM: 5.6 mmol/L — AB (ref 3.5–5.2)
SODIUM: 140 mmol/L (ref 134–144)
TOTAL PROTEIN: 7 g/dL (ref 6.0–8.5)

## 2016-10-13 LAB — LIPID PANEL
CHOLESTEROL TOTAL: 264 mg/dL — AB (ref 100–199)
Chol/HDL Ratio: 3.6 ratio units (ref 0.0–4.4)
HDL: 74 mg/dL (ref >39)
LDL Calculated: 147 mg/dL — ABNORMAL HIGH (ref 0–99)
TRIGLYCERIDES: 215 mg/dL — AB (ref 0–149)
VLDL Cholesterol Cal: 43 mg/dL — ABNORMAL HIGH (ref 5–40)

## 2016-10-13 NOTE — Telephone Encounter (Signed)
-----   Message from Margaretann LovelessJennifer M Burnette, [place]-C sent at 10/13/2016  8:27 AM EDT ----- Cholesterol up from last year but good cholesterol still high keeping cardiac risk lower. Work on healthy lifestyle modifications and adding physical activity.

## 2016-10-13 NOTE — Telephone Encounter (Signed)
Pt advised.   Thanks,   -Seema Blum  

## 2016-11-15 ENCOUNTER — Telehealth: Payer: Self-pay | Admitting: Physician Assistant

## 2016-11-15 NOTE — Telephone Encounter (Signed)
error 

## 2016-11-17 ENCOUNTER — Encounter: Payer: Self-pay | Admitting: General Surgery

## 2016-12-01 ENCOUNTER — Ambulatory Visit
Admission: RE | Admit: 2016-12-01 | Discharge: 2016-12-01 | Disposition: A | Discharge status: Home / Self Care | Payer: Medicare Other | Source: Ambulatory Visit | Attending: Physician Assistant | Admitting: Physician Assistant

## 2016-12-01 DIAGNOSIS — Z1231 Encounter for screening mammogram for malignant neoplasm of breast: Secondary | ICD-10-CM | POA: Insufficient documentation

## 2016-12-01 DIAGNOSIS — Z1239 Encounter for other screening for malignant neoplasm of breast: Secondary | ICD-10-CM

## 2016-12-02 ENCOUNTER — Telehealth: Payer: Self-pay

## 2016-12-02 NOTE — Telephone Encounter (Signed)
Patient has been advised. KW 

## 2016-12-02 NOTE — Telephone Encounter (Signed)
-----   Message from Margaretann LovelessJennifer M Burnette, [place] sent at 12/02/2016  8:24 AM EST ----- Normal mammogram. Repeat screening in one year.

## 2017-03-02 ENCOUNTER — Other Ambulatory Visit: Payer: Self-pay | Admitting: Physician Assistant

## 2017-03-02 DIAGNOSIS — Z780 Asymptomatic menopausal state: Secondary | ICD-10-CM

## 2017-11-01 ENCOUNTER — Telehealth: Payer: Self-pay | Admitting: Physician Assistant

## 2017-11-01 ENCOUNTER — Telehealth: Payer: Self-pay

## 2017-11-01 DIAGNOSIS — Z780 Asymptomatic menopausal state: Secondary | ICD-10-CM

## 2017-11-01 MED ORDER — ESTRADIOL 0.5 MG PO TABS: 0.5000 mg | ORAL_TABLET | Freq: Every day | ORAL | 3 refills | Status: DC

## 2017-11-01 NOTE — Telephone Encounter (Signed)
Rx printed

## 2017-11-01 NOTE — Telephone Encounter (Signed)
Pt to CB and schedule her AWV with [facility] and CPE with PCP. -MM

## 2017-11-01 NOTE — Telephone Encounter (Signed)
Patient advised prescription placed up front ready for pick up.  Thanks,  -Joseline 

## 2017-11-01 NOTE — Telephone Encounter (Signed)
Pt is requesting to pick a written Rx for estradiol (ESTRACE) 0.5 MG tablet  with a 90 day supply.  Pt needs to take this to a different pharmacy due to the cost.  [phone]#[phone]

## 2018-01-02 ENCOUNTER — Encounter: Payer: Self-pay | Admitting: Family Medicine

## 2018-01-02 ENCOUNTER — Ambulatory Visit (INDEPENDENT_AMBULATORY_CARE_PROVIDER_SITE_OTHER): Payer: Medicare Other | Admitting: Family Medicine

## 2018-01-02 ENCOUNTER — Ambulatory Visit (INDEPENDENT_AMBULATORY_CARE_PROVIDER_SITE_OTHER): Payer: Medicare Other

## 2018-01-02 VITALS — BP 132/84 | HR 88 | Temp 97.8°F | Ht 65.0 in | Wt 163.4 lb

## 2018-01-02 VITALS — BP 138/80 | HR 77 | Temp 97.8°F | Resp 16 | Ht 65.0 in | Wt 163.0 lb

## 2018-01-02 DIAGNOSIS — E7800 Pure hypercholesterolemia, unspecified: Secondary | ICD-10-CM

## 2018-01-02 DIAGNOSIS — Z0000 Encounter for general adult medical examination without abnormal findings: Secondary | ICD-10-CM

## 2018-01-02 DIAGNOSIS — E559 Vitamin D deficiency, unspecified: Secondary | ICD-10-CM

## 2018-01-02 DIAGNOSIS — M8589 Other specified disorders of bone density and structure, multiple sites: Secondary | ICD-10-CM | POA: Diagnosis not present

## 2018-01-02 DIAGNOSIS — Z6825 Body mass index (BMI) 25.0-25.9, adult: Secondary | ICD-10-CM | POA: Diagnosis not present

## 2018-01-02 NOTE — Progress Notes (Signed)
Subjective:   Carol Kelly is a 77 y.o. female who presents for Medicare Annual (Subsequent) preventive examination.  Review of Systems:  N/A  Cardiac Risk Factors include: advanced age (>5255men, 81>65 women);dyslipidemia     Objective:     Vitals: BP 132/84 (BP Location: Left Arm)   Pulse 88   Temp 97.8 F (36.6 C) (Oral)   Ht 5\' 5"  (1.651 m)   Wt 163 lb 6.4 oz (74.1 kg)   BMI 27.19 kg/m   Body mass index is 27.19 kg/m.  Advanced Directives 01/02/2018 10/01/2016 09/30/2015  Does Patient Have a Medical Advance Directive? Yes Yes Yes  Type of Advance Directive Living will;Healthcare Power of English as a second language teacherAttorney - Healthcare Power of [place];Living will  Copy of Healthcare Power of Attorney in Chart? Yes Yes -    Tobacco Social History   Tobacco Use  Smoking Status Former Smoker  . Types: Cigarettes  Smokeless Tobacco Never Used  Tobacco Comment   Smoked 10 years- not heavy, quit when pregnant with daughter     Counseling given: Not Answered Comment: Smoked 10 years- not heavy, quit when pregnant with daughter   Clinical Intake:  Pre-visit preparation completed: Yes  Pain : No/denies pain Pain Score: 0-No pain     Nutritional Status: BMI 25 -29 Overweight Nutritional Risks: None Diabetes: No  How often do you need to have someone help you when you read instructions, pamphlets, or other written materials from your doctor or pharmacy?: 1 - Never  Interpreter Needed?: No  Information entered by :: [facility] Surgery CenterMmarkoski, LPN  Past Medical History:  Diagnosis Date  . Allergy   . Cataract    Past Surgical History:  Procedure Laterality Date  . ABDOMINAL HYSTERECTOMY    . APPENDECTOMY    . BREAST BIOPSY Right 11/04/2014   neg  . BREAST CYST ASPIRATION Right 11/04/2014   benign  . DILATION AND CURETTAGE OF UTERUS    . EYE SURGERY Bilateral  left9/24/14   Cataract removed Right 07/18/2013  . OVARIAN CYST REMOVAL  1969  . SIGMOIDOSCOPY  1985   Family History    Problem Relation Age of Onset  . Colon polyps Father   . Heart disease Father        CVD  . Dementia Father   . Diabetes Mother   . Heart disease Brother   . Brain cancer Brother    Social History   Socioeconomic History  . Marital status: Single    Spouse name: None  . Number of children: 2  . Years of education: None  . Highest education level: Bachelor's degree (e.g., BA, AB, BS)  Social Needs  . Financial resource strain: Not hard at all  . Food insecurity - worry: Never true  . Food insecurity - inability: Never true  . Transportation needs - medical: No  . Transportation needs - non-medical: No  Occupational History  . None  Tobacco Use  . Smoking status: Former Smoker    Types: Cigarettes  . Smokeless tobacco: Never Used  . Tobacco comment: Smoked 10 years- not heavy, quit when pregnant with daughter  Substance and Sexual Activity  . Alcohol use: Yes    Alcohol/week: 0.0 oz    Comment: rare  . Drug use: No  . Sexual activity: Yes  Other Topics Concern  . None  Social History Narrative   Currently in a relationship.    Outpatient Encounter Medications as of 01/02/2018  Medication Sig  . acetaminophen (TYLENOL) 500 MG tablet  Take 500 mg by mouth every 6 (six) hours as needed.  . Calcium Carb-Cholecalciferol (CALTRATE 600+D3 SOFT) 600-800 MG-UNIT CHEW Chew by mouth 2 (two) times daily.  . cetirizine (ZYRTEC) 10 MG tablet Take 10 mg by mouth daily.  . cetirizine-pseudoephedrine (ZYRTEC-D) 5-120 MG tablet Take 1 tablet by mouth.  . cycloSPORINE (RESTASIS) 0.05 % ophthalmic emulsion Place 1 drop into both eyes 2 (two) times daily.  Marland Kitchen estradiol (ESTRACE) 0.5 MG tablet Take 1 tablet (0.5 mg total) daily by mouth.  . fluticasone (FLONASE) 50 MCG/ACT nasal spray Place 2 sprays into both nostrils.   . Multiple Vitamins-Minerals (PRESERVISION AREDS 2 PO) Take by mouth 2 (two) times daily.   . MULTIPLE VITAMINS-MINERALS PO Take 1 tablet by mouth daily.  .  [DISCONTINUED] Multiple Vitamins-Calcium (VIACTIV MULTI-VITAMIN) CHEW Chew by mouth daily.  . [DISCONTINUED] Azelastine HCl 0.15 % SOLN   . [DISCONTINUED] Calcium Carb-Cholecalciferol (CALCIUM 1000 + D) 1000-800 MG-UNIT TABS Take by mouth.   No facility-administered encounter medications on file as of 01/02/2018.     Activities of Daily Living In your present state of health, do you have any difficulty performing the following activities: 01/02/2018  Hearing? N  Vision? N  Difficulty concentrating or making decisions? N  Walking or climbing stairs? N  Dressing or bathing? N  Doing errands, shopping? N  Preparing Food and eating ? N  Using the Toilet? N  In the past six months, have you accidently leaked urine? N  Do you have problems with loss of bowel control? N  Managing your Medications? N  Managing your Finances? N  Housekeeping or managing your Housekeeping? N  Some recent data might be hidden    Patient Care Team: Erasmo Downer, MD as PCP - General (Family Medicine) Lockie Mola, MD as Referring Physician (Ophthalmology) Linus Salmons, MD as Consulting Physician (Otolaryngology)    Assessment:   This is a routine wellness examination for Carol Kelly.  Exercise Activities and Dietary recommendations Current Exercise Habits: The patient does not participate in regular exercise at present, Exercise limited by: None identified  Goals    . Exercise 150 minutes per week (moderate activity)     Pt to start back walking 5 days a week for 30 minutes at a time.        Fall Risk Fall Risk  01/02/2018 10/01/2016 09/30/2015  Falls in the past year? No No No   Is the patient's home free of loose throw rugs in walkways, pet beds, electrical cords, etc?   yes      Grab bars in the bathroom? no      Handrails on the stairs?   yes      Adequate lighting?   yes  Timed Get Up and Go performed: N/A  Depression Screen PHQ 2/9 Scores 01/02/2018 01/02/2018 10/01/2016  09/30/2015  PHQ - 2 Score 0 0 0 0  PHQ- 9 Score 1 - - -     Cognitive Function: Pt declined screening today.     6CIT Screen 10/01/2016  What Year? 0 points  What month? 0 points  What time? 0 points  Count back from 20 0 points  Months in reverse 0 points  Repeat phrase 0 points  Total Score 0    Immunization History  Administered Date(s) Administered  . Influenza,inj,Quad PF,6+ Mos 08/30/2014  . Influenza-Unspecified 09/15/2016, 09/23/2017  . Pneumococcal Conjugate-13 08/30/2014  . Pneumococcal Polysaccharide-23 06/27/2013, 06/27/2014  . Zoster 10/27/2011    Qualifies for Shingles Vaccine? Due  for Shingles vaccine. Declined my offer to administer today. Education has been provided regarding the importance of this vaccine. Pt has been advised to call her insurance company to determine her out of pocket expense. Advised she may also receive this vaccine at her local pharmacy or Health Dept. Verbalized acceptance and understanding.  Screening Tests Health Maintenance  Topic Date Due  . TETANUS/TDAP  12/29/2021 (Originally 08/26/1960)  . Fecal DNA (Cologuard)  10/20/2019  . INFLUENZA VACCINE  Completed  . DEXA SCAN  Completed  . PNA vac Low Risk Adult  Completed    Cancer Screenings: Lung: Low Dose CT Chest recommended if Age 79-80 years, 30 pack-year currently smoking OR have quit w/in 15years. Patient does not qualify. Breast:  Up to date on Mammogram? Yes   Up to date of Bone Density/Dexa? Yes Colorectal: Up to date  Additional Screenings:  Hepatitis B/HIV/Syphillis: Pt declines today.  Hepatitis C Screening: Pt declines today.      Plan:  I have personally reviewed and addressed the Medicare Annual Wellness questionnaire and have noted the following in the patient's chart:  A. Medical and social history B. Use of alcohol, tobacco or illicit drugs  C. Current medications and supplements D. Functional ability and status E.  Nutritional status F.  Physical  activity G. Advance directives H. List of other physicians I.  Hospitalizations, surgeries, and ER visits in previous 12 months J.  Vitals K. Screenings such as hearing and vision if needed, cognitive and depression L. Referrals and appointments - none  In addition, I have reviewed and discussed with patient certain preventive protocols, quality metrics, and best practice recommendations. A written personalized care plan for preventive services as well as general preventive health recommendations were provided to patient.  See attached scanned questionnaire for additional information.   Signed,  Hyacinth Meeker, LPN Nurse Health Advisor   Nurse Recommendations: None.

## 2018-01-02 NOTE — Progress Notes (Signed)
Patient: Carol Kelly, Female    DOB: 06-25-1941, 77 y.o.   MRN: [id number] Visit Date: 01/02/2018  Today's Provider: [name], MD   I, Joslyn Hy, CMA, am acting as scribe for [name], MD.  Chief Complaint  Patient presents with  . Annual Exam   Subjective:    Annual physical exam Carol Kelly is a 77 y.o. female who presents today for health maintenance and complete physical. She feels well; she is c/o seasonal allergies. She reports exercising some; has not been exercising since injuring her knee after putting away Christmas decorations. She was previously walking on treadmill 5-6 days per week. She reports she is sleeping fairly well.  She had AWV today, prior to seeing PCP for CPE.  Notes reviewed Last colonoscopy- 02/19/2011- diverticulosis.  Cologuard negative in 2017 Last mammogram- 12/01/2016- BI-RADS 1. Wants to have q2 yr testing. Declines mammogram this year.  Last BMD- 10/20/2015- osteopenia.  Declines re-testing at this time. Agrees to Shingrix vaccination - will put on waitlist -----------------------------------------------------------------   Review of Systems  Constitutional: Positive for unexpected weight change. Negative for activity change, appetite change, chills, diaphoresis, fatigue and fever.  HENT: Positive for sinus pressure. Negative for congestion, dental problem, drooling, ear discharge, ear pain, facial swelling, hearing loss, mouth sores, nosebleeds, postnasal drip, rhinorrhea, sinus pain, sneezing, sore throat, tinnitus, trouble swallowing and voice change.   Eyes: Positive for photophobia. Negative for pain, discharge, redness, itching and visual disturbance.  Respiratory: Negative.   Cardiovascular: Negative.   Gastrointestinal: Negative.   Endocrine: Negative.   Genitourinary: Negative.   Musculoskeletal: Negative.   Skin: Negative.   Allergic/Immunologic: Positive for environmental allergies. Negative  for food allergies and immunocompromised state.  Neurological: Negative.   Hematological: Negative.   Psychiatric/Behavioral: Negative.     Social History      She  reports that she has quit smoking. Her smoking use included cigarettes. she has never used smokeless tobacco. She reports that she does not drink alcohol or use drugs.       Social History   Socioeconomic History  . Marital status: Single    Spouse name: None  . Number of children: 2  . Years of education: 16  . Highest education level: Bachelor's degree (e.g., BA, AB, BS)  Social Needs  . Financial resource strain: Not hard at all  . Food insecurity - worry: Never true  . Food insecurity - inability: Never true  . Transportation needs - medical: No  . Transportation needs - non-medical: No  Occupational History  . None  Tobacco Use  . Smoking status: Former Smoker    Types: Cigarettes  . Smokeless tobacco: Never Used  . Tobacco comment: Smoked 10 years- not heavy, quit when pregnant with daughter  Substance and Sexual Activity  . Alcohol use: No    Alcohol/week: 0.0 oz    Frequency: Never  . Drug use: No  . Sexual activity: Yes  Other Topics Concern  . None  Social History Narrative   Currently in a relationship.    Past Medical History:  Diagnosis Date  . Allergy   . Cataract      Patient Active Problem List   Diagnosis Date Noted  . Breast mass, right 10/06/2015  . Allergic rhinitis 09/26/2015  . Absolute anemia 09/26/2015  . Body mass index (BMI) of 25.0-25.9 in adult 09/26/2015  . DD (diverticular disease) 09/26/2015  . Hot flash, menopausal 09/26/2015  . Hypercholesteremia 09/26/2015  .  OP (osteoporosis) 09/26/2015  . Acne erythematosa 09/26/2015  . Avitaminosis D 09/26/2015  . Post-menopausal 08/19/2015    Past Surgical History:  Procedure Laterality Date  . ABDOMINAL HYSTERECTOMY    . APPENDECTOMY    . BREAST BIOPSY Right 11/04/2014   neg  . BREAST CYST ASPIRATION Right  11/04/2014   benign  . DILATION AND CURETTAGE OF UTERUS    . EYE SURGERY Bilateral  left9/24/14   Cataract removed Right 07/18/2013  . OVARIAN CYST REMOVAL  1969  . SIGMOIDOSCOPY  1985    Family History        Family Status  Relation Name Status  . Father  Deceased at age 22  . Mother  Deceased at age 27       stroke  . Brother  Alive  . Brother  Alive        Her family history includes AAA (abdominal aortic aneurysm) in her father; Brain cancer in her brother; Colon polyps in her father; Dementia in her father; Diabetes in her mother; Heart disease in her brother; Transient ischemic attack in her father.     Allergies  Allergen Reactions  . Achromycin [Tetracycline] Hives  . Neosporin  [Neomycin-Bacitracin Zn-Polymyx]     Other reaction(s): Urticaria  . Aspirin Rash     Current Outpatient Medications:  .  acetaminophen (TYLENOL) 500 MG tablet, Take 500 mg by mouth every 6 (six) hours as needed., Disp: , Rfl:  .  Calcium Carb-Cholecalciferol (CALTRATE 600+D3 SOFT) 600-800 MG-UNIT CHEW, Chew by mouth 2 (two) times daily., Disp: , Rfl:  .  cetirizine (ZYRTEC) 10 MG tablet, Take 10 mg by mouth daily., Disp: , Rfl:  .  cetirizine-pseudoephedrine (ZYRTEC-D) 5-120 MG tablet, Take 1 tablet by mouth., Disp: , Rfl:  .  cycloSPORINE (RESTASIS) 0.05 % ophthalmic emulsion, Place 1 drop into both eyes 2 (two) times daily., Disp: , Rfl:  .  estradiol (ESTRACE) 0.5 MG tablet, Take 1 tablet (0.5 mg total) daily by mouth., Disp: 90 tablet, Rfl: 3 .  fluticasone (FLONASE) 50 MCG/ACT nasal spray, Place 2 sprays into both nostrils. , Disp: , Rfl:  .  Multiple Vitamins-Minerals (PRESERVISION AREDS 2 PO), Take by mouth 2 (two) times daily. , Disp: , Rfl:  .  MULTIPLE VITAMINS-MINERALS PO, Take 1 tablet by mouth daily., Disp: , Rfl:    Patient Care Team: Erasmo Downer, MD as PCP - General (Family Medicine) Lockie Mola, MD as Referring Physician (Ophthalmology) Linus Salmons, MD  as Consulting Physician (Otolaryngology)      Objective:   Vitals: BP 138/80 (BP Location: Left Arm, Patient Position: Sitting, Cuff Size: Large)   Pulse 77   Temp 97.8 F (36.6 C) (Oral)   Resp 16   Ht 5\' 5"  (1.651 m)   Wt 163 lb (73.9 kg)   SpO2 99%   BMI 27.12 kg/m    Vitals:   01/02/18 1005  BP: 138/80  Pulse: 77  Resp: 16  Temp: 97.8 F (36.6 C)  TempSrc: Oral  SpO2: 99%  Weight: 163 lb (73.9 kg)  Height: 5\' 5"  (1.651 m)     Physical Exam  Constitutional: She is oriented to person, place, and time. She appears well-developed and well-nourished. No distress.  HENT:  Head: Normocephalic and atraumatic.  Right Ear: External ear normal.  Left Ear: External ear normal.  Nose: Nose normal.  Mouth/Throat: Oropharynx is clear and moist.  Eyes: Conjunctivae and EOM are normal. Pupils are equal, round, and reactive to light.  No scleral icterus.  Neck: Neck supple. No thyromegaly present.  Cardiovascular: Normal rate, regular rhythm, normal heart sounds and intact distal pulses.  No murmur heard. Pulmonary/Chest: Breath sounds normal. No respiratory distress. She has no wheezes. She has no rales.  Abdominal: Soft. Bowel sounds are normal. She exhibits no distension. There is no tenderness.  Genitourinary:  Genitourinary Comments: Breasts: breasts appear normal, no suspicious masses, no skin or nipple changes or axillary nodes.  Musculoskeletal: She exhibits no edema or deformity.  Lymphadenopathy:    She has no cervical adenopathy.  Neurological: She is alert and oriented to person, place, and time.  Skin: Skin is warm and dry. No rash noted.  Psychiatric: She has a normal mood and affect. Her behavior is normal.  Vitals reviewed.    Depression Screen PHQ 2/9 Scores 01/02/2018 01/02/2018 10/01/2016 09/30/2015  PHQ - 2 Score 0 0 0 0  PHQ- 9 Score 1 - - -      Assessment & Plan:     Routine Health Maintenance and Physical Exam  Exercise Activities and Dietary  recommendations Goals    . Exercise 150 minutes per week (moderate activity)     Pt to start back walking 5 days a week for 30 minutes at a time.        Immunization History  Administered Date(s) Administered  . Influenza,inj,Quad PF,6+ Mos 08/30/2014  . Influenza-Unspecified 09/15/2016, 09/23/2017  . Pneumococcal Conjugate-13 08/30/2014  . Pneumococcal Polysaccharide-23 06/27/2013, 06/27/2014  . Zoster 10/27/2011    Health Maintenance  Topic Date Due  . TETANUS/TDAP  12/29/2021 (Originally 08/26/1961)  . Fecal DNA (Cologuard)  10/20/2019  . INFLUENZA VACCINE  Completed  . DEXA SCAN  Completed  . PNA vac Low Risk Adult  Completed     Discussed health benefits of physical activity, and encouraged her to engage in regular exercise appropriate for her age and condition.    Problem List Items Addressed This Visit      Musculoskeletal and Integument   Osteopenia     Other   Body mass index (BMI) of 25.0-25.9 in adult   Hypercholesteremia   Relevant Orders   Comprehensive metabolic panel   Lipid panel   Avitaminosis D   Relevant Orders   VITAMIN D 25 Hydroxy (Vit-D Deficiency, Fractures)    Other Visit Diagnoses    Encounter for annual physical exam    -  Primary   Relevant Orders   Comprehensive metabolic panel   Lipid panel   CBC   VITAMIN D 25 Hydroxy (Vit-D Deficiency, Fractures)      -------------------------------------------------------------------- Return in about 1 year (around 01/02/2019) for CPE/AWV with [facility].   The entirety of the information documented in the History of Present Illness, Review of Systems and Physical Exam were personally obtained by me. Portions of this information were initially documented by Irving BurtonEmily Ratchford, CMA and reviewed by me for thoroughness and accuracy.    Erasmo DownerBacigalupo, Tallie Hevia M, MD, MPH [facility] 01/02/2018 10:51 AM

## 2018-01-02 NOTE — Patient Instructions (Signed)
Preventive Care 76 Years and Older, Female Preventive care refers to lifestyle choices and visits with your health care provider that can promote health and wellness. What does preventive care include?  A yearly physical exam. This is also called an annual well check.  Dental exams once or twice a year.  Routine eye exams. Ask your health care provider how often you should have your eyes checked.  Personal lifestyle choices, including: ? Daily care of your teeth and gums. ? Regular physical activity. ? Eating a healthy diet. ? Avoiding tobacco and drug use. ? Limiting alcohol use. ? Practicing safe sex. ? Taking low-dose aspirin every day. ? Taking vitamin and mineral supplements as recommended by your health care provider. What happens during an annual well check? The services and screenings done by your health care provider during your annual well check will depend on your age, overall health, lifestyle risk factors, and family history of disease. Counseling Your health care provider may ask you questions about your:  Alcohol use.  Tobacco use.  Drug use.  Emotional well-being.  Home and relationship well-being.  Sexual activity.  Eating habits.  History of falls.  Memory and ability to understand (cognition).  Work and work environment.  Reproductive health.  Screening You may have the following tests or measurements:  Height, weight, and BMI.  Blood pressure.  Lipid and cholesterol levels. These may be checked every 5 years, or more frequently if you are over 50 years old.  Skin check.  Lung cancer screening. You may have this screening every year starting at age 76 if you have a 30-pack-year history of smoking and currently smoke or have quit within the past 15 years.  Fecal occult blood test (FOBT) of the stool. You may have this test every year starting at age 76.  Flexible sigmoidoscopy or colonoscopy. You may have a sigmoidoscopy every 5 years or  a colonoscopy every 10 years starting at age 76.  Hepatitis C blood test.  Hepatitis B blood test.  Sexually transmitted disease (STD) testing.  Diabetes screening. This is done by checking your blood sugar (glucose) after you have not eaten for a while (fasting). You may have this done every 1-3 years.  Bone density scan. This is done to screen for osteoporosis. You may have this done starting at age 76.  Mammogram. This may be done every 1-2 years. Talk to your health care provider about how often you should have regular mammograms.  Talk with your health care provider about your test results, treatment options, and if necessary, the need for more tests. Vaccines Your health care provider may recommend certain vaccines, such as:  Influenza vaccine. This is recommended every year.  Tetanus, diphtheria, and acellular pertussis (Tdap, Td) vaccine. You may need a Td booster every 10 years.  Varicella vaccine. You may need this if you have not been vaccinated.  Zoster vaccine. You may need this after age 76.  Measles, mumps, and rubella (MMR) vaccine. You may need at least one dose of MMR if you were born in 1957 or later. You may also need a second dose.  Pneumococcal 13-valent conjugate (PCV13) vaccine. One dose is recommended after age 65.  Pneumococcal polysaccharide (PPSV23) vaccine. One dose is recommended after age 65.  Meningococcal vaccine. You may need this if you have certain conditions.  Hepatitis A vaccine. You may need this if you have certain conditions or if you travel or work in places where you may be exposed to hepatitis   A.  Hepatitis B vaccine. You may need this if you have certain conditions or if you travel or work in places where you may be exposed to hepatitis B.  Haemophilus influenzae type b (Hib) vaccine. You may need this if you have certain conditions.  Talk to your health care provider about which screenings and vaccines you need and how often you  need them. This information is not intended to replace advice given to you by your health care provider. Make sure you discuss any questions you have with your health care provider. Document Released: 01/02/2016 Document Revised: 08/25/2016 Document Reviewed: 10/07/2015 Elsevier Interactive Patient Education  2018 [employer] Inc[employer]  

## 2018-01-02 NOTE — Patient Instructions (Addendum)
Carol Kelly , Thank you for taking time to come for your Medicare Wellness Visit. I appreciate your ongoing commitment to your health goals. Please review the following plan we discussed and let me know if I can assist you in the future.   Screening recommendations/referrals: Colonoscopy: Up to date Mammogram: Up to date Bone Density: Up to date Recommended yearly ophthalmology/optometry visit for glaucoma screening and checkup Recommended yearly dental visit for hygiene and checkup  Vaccinations: Influenza vaccine: Up to date Pneumococcal vaccine: Up to date Tdap vaccine: Up to date Shingles vaccine: Pt declines today.   Advanced directives: Already on file at hospital  Conditions/risks identified: Pt to start back walking 5 days a week for 30 minutes at a time.   Next appointment: 10:00 AM today   Preventive Care 65 Years and Older, Female Preventive care refers to lifestyle choices and visits with your health care provider that can promote health and wellness. What does preventive care include?  A yearly physical exam. This is also called an annual well check.  Dental exams once or twice a year.  Routine eye exams. Ask your health care provider how often you should have your eyes checked.  Personal lifestyle choices, including:  Daily care of your teeth and gums.  Regular physical activity.  Eating a healthy diet.  Avoiding tobacco and drug use.  Limiting alcohol use.  Practicing safe sex.  Taking low-dose aspirin every day.  Taking vitamin and mineral supplements as recommended by your health care provider. What happens during an annual well check? The services and screenings done by your health care provider during your annual well check will depend on your age, overall health, lifestyle risk factors, and family history of disease. Counseling  Your health care provider may ask you questions about your:  Alcohol use.  Tobacco use.  Drug  use.  Emotional well-being.  Home and relationship well-being.  Sexual activity.  Eating habits.  History of falls.  Memory and ability to understand (cognition).  Work and work Astronome[employer]vironment.  Reproductive health. Screening  You may have the following tests or measurements:  Height, weight, and BMI.  Blood pressure.  Lipid and cholesterol levels. These may be checked every 5 years, or more frequently if you are over 76 years old.  Skin check.  Lung cancer screening. You may have this screening every year starting at age 76 if you have a 30-pack-year history of smoking and currently smoke or have quit within the past 15 years.  Fecal occult blood test (FOBT) of the stool. You may have this test every year starting at age 76.  Flexible sigmoidoscopy or colonoscopy. You may have a sigmoidoscopy every 5 years or a colonoscopy every 10 years starting at age 76.  Hepatitis C blood test.  Hepatitis B blood test.  Sexually transmitted disease (STD) testing.  Diabetes screening. This is done by checking your blood sugar (glucose) after you have not eaten for a while (fasting). You may have this done every 1-3 years.  Bone density scan. This is done to screen for osteoporosis. You may have this done starting at age 10365.  Mammogram. This may be done every 1-2 years. Talk to your health care provider about how often you should have regular mammograms. Talk with your health care provider about your test results, treatment options, and if necessary, the need for more tests. Vaccines  Your health care provider may recommend certain vaccines, such as:  Influenza vaccine. This is recommended every year.  Tetanus, diphtheria, and acellular pertussis (Tdap, Td) vaccine. You may need a Td booster every 10 years.  Zoster vaccine. You may need this after age 44.  Pneumococcal 13-valent conjugate (PCV13) vaccine. One dose is recommended after age 55.  Pneumococcal polysaccharide  (PPSV23) vaccine. One dose is recommended after age 5. Talk to your health care provider about which screenings and vaccines you need and how often you need them. This information is not intended to replace advice given to you by your health care provider. Make sure you discuss any questions you have with your health care provider. Document Released: 01/02/2016 Document Revised: 08/25/2016 Document Reviewed: 10/07/2015 Elsevier Interactive Patient Education  2017 [place] Prevention in the Home Falls can cause injuries. They can happen to people of all ages. There are many things you can do to make your home safe and to help prevent falls. What can I do on the outside of my home?  Regularly fix the edges of walkways and driveways and fix any cracks.  Remove anything that might make you trip as you walk through a door, such as a raised step or threshold.  Trim any bushes or trees on the path to your home.  Use bright outdoor lighting.  Clear any walking paths of anything that might make someone trip, such as rocks or tools.  Regularly check to see if handrails are loose or broken. Make sure that both sides of any steps have handrails.  Any raised decks and porches should have guardrails on the edges.  Have any leaves, snow, or ice cleared regularly.  Use sand or salt on walking paths during winter.  Clean up any spills in your garage right away. This includes oil or grease spills. What can I do in the bathroom?  Use night lights.  Install grab bars by the toilet and in the tub and shower. Do not use towel bars as grab bars.  Use non-skid mats or decals in the tub or shower.  If you need to sit down in the shower, use a plastic, non-slip stool.  Keep the floor dry. Clean up any water that spills on the floor as soon as it happens.  Remove soap buildup in the tub or shower regularly.  Attach bath mats securely with double-sided non-slip rug tape.  Do not have  throw rugs and other things on the floor that can make you trip. What can I do in the bedroom?  Use night lights.  Make sure that you have a light by your bed that is easy to reach.  Do not use any sheets or blankets that are too big for your bed. They should not hang down onto the floor.  Have a firm chair that has side arms. You can use this for support while you get dressed.  Do not have throw rugs and other things on the floor that can make you trip. What can I do in the kitchen?  Clean up any spills right away.  Avoid walking on wet floors.  Keep items that you use a lot in easy-to-reach places.  If you need to reach something above you, use a strong step stool that has a grab bar.  Keep electrical cords out of the way.  Do not use floor polish or wax that makes floors slippery. If you must use wax, use non-skid floor wax.  Do not have throw rugs and other things on the floor that can make you trip. What can I do  with my stairs?  Do not leave any items on the stairs.  Make sure that there are handrails on both sides of the stairs and use them. Fix handrails that are broken or loose. Make sure that handrails are as long as the stairways.  Check any carpeting to make sure that it is firmly attached to the stairs. Fix any carpet that is loose or worn.  Avoid having throw rugs at the top or bottom of the stairs. If you do have throw rugs, attach them to the floor with carpet tape.  Make sure that you have a light switch at the top of the stairs and the bottom of the stairs. If you do not have them, ask someone to add them for you. What else can I do to help prevent falls?  Wear shoes that:  Do not have high heels.  Have rubber bottoms.  Are comfortable and fit you well.  Are closed at the toe. Do not wear sandals.  If you use a stepladder:  Make sure that it is fully opened. Do not climb a closed stepladder.  Make sure that both sides of the stepladder are  locked into place.  Ask someone to hold it for you, if possible.  Clearly mark and make sure that you can see:  Any grab bars or handrails.  First and last steps.  Where the edge of each step is.  Use tools that help you move around (mobility aids) if they are needed. These include:  Canes.  Walkers.  Scooters.  Crutches.  Turn on the lights when you go into a dark area. Replace any light bulbs as soon as they burn out.  Set up your furniture so you have a clear path. Avoid moving your furniture around.  If any of your floors are uneven, fix them.  If there are any pets around you, be aware of where they are.  Review your medicines with your doctor. Some medicines can make you feel dizzy. This can increase your chance of falling. Ask your doctor what other things that you can do to help prevent falls. This information is not intended to replace advice given to you by your health care provider. Make sure you discuss any questions you have with your health care provider. Document Released: 10/02/2009 Document Revised: 05/13/2016 Document Reviewed: 01/10/2015 Elsevier Interactive Patient Education  2017 [employer].

## 2018-01-03 ENCOUNTER — Telehealth: Payer: Self-pay

## 2018-01-03 LAB — LIPID PANEL
CHOLESTEROL TOTAL: 240 mg/dL — AB (ref 100–199)
Chol/HDL Ratio: 3.4 ratio (ref 0.0–4.4)
HDL: 71 mg/dL (ref >39)
LDL Calculated: 138 mg/dL — ABNORMAL HIGH (ref 0–99)
Triglycerides: 154 mg/dL — ABNORMAL HIGH (ref 0–149)
VLDL CHOLESTEROL CAL: 31 mg/dL (ref 5–40)

## 2018-01-03 LAB — CBC
HEMATOCRIT: 37.1 % (ref 34.0–46.6)
Hemoglobin: 12.3 g/dL (ref 11.1–15.9)
MCH: 31.3 pg (ref 26.6–33.0)
MCHC: 33.2 g/dL (ref 31.5–35.7)
MCV: 94 fL (ref 79–97)
PLATELETS: 295 10*3/uL (ref 150–379)
RBC: 3.93 x10E6/uL (ref 3.77–5.28)
RDW: 13.2 % (ref 12.3–15.4)
WBC: 6.5 10*3/uL (ref 3.4–10.8)

## 2018-01-03 LAB — COMPREHENSIVE METABOLIC PANEL
ALK PHOS: 74 IU/L (ref 39–117)
ALT: 10 IU/L (ref 0–32)
AST: 14 IU/L (ref 0–40)
Albumin/Globulin Ratio: 1.7 (ref 1.2–2.2)
Albumin: 4.6 g/dL (ref 3.5–4.8)
BILIRUBIN TOTAL: 0.3 mg/dL (ref 0.0–1.2)
BUN / CREAT RATIO: 16 (ref 12–28)
BUN: 14 mg/dL (ref 8–27)
CHLORIDE: 100 mmol/L (ref 96–106)
CO2: 25 mmol/L (ref 20–29)
Calcium: 9.8 mg/dL (ref 8.7–10.3)
Creatinine, Ser: 0.87 mg/dL (ref 0.57–1.00)
GFR calc non Af Amer: 65 mL/min/{1.73_m2} (ref >59)
GFR, EST AFRICAN AMERICAN: 75 mL/min/{1.73_m2} (ref >59)
GLUCOSE: 101 mg/dL — AB (ref 65–99)
Globulin, Total: 2.7 g/dL (ref 1.5–4.5)
Potassium: 5 mmol/L (ref 3.5–5.2)
Sodium: 138 mmol/L (ref 134–144)
Total Protein: 7.3 g/dL (ref 6.0–8.5)

## 2018-01-03 LAB — VITAMIN D 25 HYDROXY (VIT D DEFICIENCY, FRACTURES): Vit D, 25-Hydroxy: 52.4 ng/mL (ref 30.0–100.0)

## 2018-01-03 NOTE — Telephone Encounter (Signed)
Pt advised. States she started doing daily exercise x 30 minutes, and agrees to low saturated fat diet.

## 2018-01-03 NOTE — Telephone Encounter (Signed)
-----   Message from Erasmo DownerAngela M Bacigalupo, MD sent at 01/03/2018 10:08 AM EST ----- Normal kidney function, liver function, electrolytes, Blood counts, vitamin D level.  Cholesterol is high, but slightly better than last year.  10 year risk of heart disease/stroke is also high at 18.7%.   At this level, would recommend statin therapy to decrease risk of heart disease/stroke.  I believe when I spoke to patient, she did not want to take a statin though.  Either way, she should increase exercise (30 min 5 days weekly at least) and eat low saturated fat diet.  Erasmo DownerBacigalupo, Angela M, MD, MPH [facility] 01/03/2018 10:08 AM

## 2018-04-06 ENCOUNTER — Other Ambulatory Visit: Payer: Self-pay | Admitting: Physician Assistant

## 2018-04-06 DIAGNOSIS — Z780 Asymptomatic menopausal state: Secondary | ICD-10-CM

## 2018-04-06 NOTE — Telephone Encounter (Signed)
Saw you for physical 01/02/2018.

## 2018-05-04 ENCOUNTER — Other Ambulatory Visit: Payer: Self-pay | Admitting: Family Medicine

## 2018-05-04 DIAGNOSIS — Z780 Asymptomatic menopausal state: Secondary | ICD-10-CM

## 2018-05-04 MED ORDER — ESTRADIOL 0.5 MG PO TABS: 0.5000 mg | ORAL_TABLET | Freq: Every day | ORAL | 1 refills | Status: DC

## 2018-05-04 NOTE — Telephone Encounter (Signed)
Pt contacted office for refill request on the following medications:  estradiol (ESTRACE) 0.5 MG tablet  90 day supply   [facility]  Pt is requesting the Rx for 90 day supply with refills so she doesn't have to pick up the medication once a month. Please advise TNP

## 2018-10-26 ENCOUNTER — Other Ambulatory Visit: Payer: Self-pay

## 2018-10-26 DIAGNOSIS — Z780 Asymptomatic menopausal state: Secondary | ICD-10-CM

## 2018-10-26 MED ORDER — ESTRADIOL 0.5 MG PO TABS: 0.5000 mg | ORAL_TABLET | Freq: Every day | ORAL | 1 refills | Status: DC

## 2019-01-03 ENCOUNTER — Encounter: Payer: Self-pay | Admitting: Family Medicine

## 2019-01-03 ENCOUNTER — Ambulatory Visit (INDEPENDENT_AMBULATORY_CARE_PROVIDER_SITE_OTHER): Payer: Medicare Other

## 2019-01-03 ENCOUNTER — Ambulatory Visit (INDEPENDENT_AMBULATORY_CARE_PROVIDER_SITE_OTHER): Payer: Medicare Other | Admitting: Family Medicine

## 2019-01-03 VITALS — BP 112/80 | HR 90 | Temp 97.9°F | Ht 65.0 in | Wt 165.2 lb

## 2019-01-03 DIAGNOSIS — Z862 Personal history of diseases of the blood and blood-forming organs and certain disorders involving the immune mechanism: Secondary | ICD-10-CM

## 2019-01-03 DIAGNOSIS — Z780 Asymptomatic menopausal state: Secondary | ICD-10-CM

## 2019-01-03 DIAGNOSIS — E7800 Pure hypercholesterolemia, unspecified: Secondary | ICD-10-CM

## 2019-01-03 DIAGNOSIS — Z0000 Encounter for general adult medical examination without abnormal findings: Secondary | ICD-10-CM

## 2019-01-03 MED ORDER — ESTRADIOL 0.5 MG PO TABS: 0.5000 mg | ORAL_TABLET | Freq: Every day | ORAL | 3 refills | Status: DC

## 2019-01-03 NOTE — Patient Instructions (Addendum)
 The CDC recommends two doses of Shingrix (the shingles vaccine) separated by 2 to 6 months for adults age 77 years and older. I recommend checking with your insurance plan regarding coverage for this vaccine.      Preventive Care 65 Years and Older, Female Preventive care refers to lifestyle choices and visits with your health care provider that can promote health and wellness. What does preventive care include?  A yearly physical exam. This is also called an annual well check.  Dental exams once or twice a year.  Routine eye exams. Ask your health care provider how often you should have your eyes checked.  Personal lifestyle choices, including: ? Daily care of your teeth and gums. ? Regular physical activity. ? Eating a healthy diet. ? Avoiding tobacco and drug use. ? Limiting alcohol use. ? Practicing safe sex. ? Taking low-dose aspirin every day. ? Taking vitamin and mineral supplements as recommended by your health care provider. What happens during an annual well check? The services and screenings done by your health care provider during your annual well check will depend on your age, overall health, lifestyle risk factors, and family history of disease. Counseling Your health care provider may ask you questions about your:  Alcohol use.  Tobacco use.  Drug use.  Emotional well-being.  Home and relationship well-being.  Sexual activity.  Eating habits.  History of falls.  Memory and ability to understand (cognition).  Work and work environment.  Reproductive health.  Screening You may have the following tests or measurements:  Height, weight, and BMI.  Blood pressure.  Lipid and cholesterol levels. These may be checked every 5 years, or more frequently if you are over 77 years old.  Skin check.  Lung cancer screening. You may have this screening every year starting at age 55 if you have a 30-pack-year history of smoking and currently smoke or have  quit within the past 15 years.  Colorectal cancer screening. All adults should have this screening starting at age 77 and continuing until age 75. You will have tests every 1-10 years, depending on your results and the type of screening test. People at increased risk should start screening at an earlier age. Screening tests may include: ? Guaiac-based fecal occult blood testing. ? Fecal immunochemical test (FIT). ? Stool DNA test. ? Virtual colonoscopy. ? Sigmoidoscopy. During this test, a flexible tube with a tiny camera (sigmoidoscope) is used to examine your rectum and lower colon. The sigmoidoscope is inserted through your anus into your rectum and lower colon. ? Colonoscopy. During this test, a long, thin, flexible tube with a tiny camera (colonoscope) is used to examine your entire colon and rectum.  Hepatitis C blood test.  Hepatitis B blood test.  Sexually transmitted disease (STD) testing.  Diabetes screening. This is done by checking your blood sugar (glucose) after you have not eaten for a while (fasting). You may have this done every 1-3 years.  Bone density scan. This is done to screen for osteoporosis. You may have this done starting at age 65.  Mammogram. This may be done every 1-2 years. Talk to your health care provider about how often you should have regular mammograms. Talk with your health care provider about your test results, treatment options, and if necessary, the need for more tests. Vaccines Your health care provider may recommend certain vaccines, such as:  Influenza vaccine. This is recommended every year.  Tetanus, diphtheria, and acellular pertussis (Tdap, Td) vaccine. You may need   a Td booster every 10 years.  Varicella vaccine. You may need this if you have not been vaccinated.  Zoster vaccine. You may need this after age 60.  Measles, mumps, and rubella (MMR) vaccine. You may need at least one dose of MMR if you were born in 1957 or later. You may  also need a second dose.  Pneumococcal 13-valent conjugate (PCV13) vaccine. One dose is recommended after age 65.  Pneumococcal polysaccharide (PPSV23) vaccine. One dose is recommended after age 65.  Meningococcal vaccine. You may need this if you have certain conditions.  Hepatitis A vaccine. You may need this if you have certain conditions or if you travel or work in places where you may be exposed to hepatitis A.  Hepatitis B vaccine. You may need this if you have certain conditions or if you travel or work in places where you may be exposed to hepatitis B.  Haemophilus influenzae type b (Hib) vaccine. You may need this if you have certain conditions. Talk to your health care provider about which screenings and vaccines you need and how often you need them. This information is not intended to replace advice given to you by your health care provider. Make sure you discuss any questions you have with your health care provider. Document Released: 01/02/2016 Document Revised: 01/26/2018 Document Reviewed: 10/07/2015 Elsevier Interactive Patient Education  2019 [employer] Inc.  

## 2019-01-03 NOTE — Patient Instructions (Addendum)
Carol Kelly , Thank you for taking time to come for your Medicare Wellness Visit. I appreciate your ongoing commitment to your health goals. Please review the following plan we discussed and let me know if I can assist you in the future.   Screening recommendations/referrals: Colonoscopy: No longer required.  Mammogram: No longer required.  Bone Density: Up to date, due 09/2020 Recommended yearly ophthalmology/optometry visit for glaucoma screening and checkup Recommended yearly dental visit for hygiene and checkup  Vaccinations: Influenza vaccine: Up to date Pneumococcal vaccine: Completed series Tdap vaccine: Pt declines today.  Shingles vaccine: Pt declines today.     Advanced directives: Currently on file.   Conditions/risks identified: Recommend to continue trying to cut portion sizes, avoid fatty foods and walk 3 times a week for at least 30 minutes.   Next appointment: 10:00 AM today with Dr Beryle Flock.    Preventive Care 41 Years and Older, Female Preventive care refers to lifestyle choices and visits with your health care provider that can promote health and wellness. What does preventive care include?  A yearly physical exam. This is also called an annual well check.  Dental exams once or twice a year.  Routine eye exams. Ask your health care provider how often you should have your eyes checked.  Personal lifestyle choices, including:  Daily care of your teeth and gums.  Regular physical activity.  Eating a healthy diet.  Avoiding tobacco and drug use.  Limiting alcohol use.  Practicing safe sex.  Taking low-dose aspirin every day.  Taking vitamin and mineral supplements as recommended by your health care provider. What happens during an annual well check? The services and screenings done by your health care provider during your annual well check will depend on your age, overall health, lifestyle risk factors, and family history of disease. Counseling    Your health care provider may ask you questions about your:  Alcohol use.  Tobacco use.  Drug use.  Emotional well-being.  Home and relationship well-being.  Sexual activity.  Eating habits.  History of falls.  Memory and ability to understand (cognition).  Work and work Astronomer.  Reproductive health. Screening  You may have the following tests or measurements:  Height, weight, and BMI.  Blood pressure.  Lipid and cholesterol levels. These may be checked every 5 years, or more frequently if you are over 65 years old.  Skin check.  Lung cancer screening. You may have this screening every year starting at age 47 if you have a 30-pack-year history of smoking and currently smoke or have quit within the past 15 years.  Fecal occult blood test (FOBT) of the stool. You may have this test every year starting at age 60.  Flexible sigmoidoscopy or colonoscopy. You may have a sigmoidoscopy every 5 years or a colonoscopy every 10 years starting at age 39.  Hepatitis C blood test.  Hepatitis B blood test.  Sexually transmitted disease (STD) testing.  Diabetes screening. This is done by checking your blood sugar (glucose) after you have not eaten for a while (fasting). You may have this done every 1-3 years.  Bone density scan. This is done to screen for osteoporosis. You may have this done starting at age 63.  Mammogram. This may be done every 1-2 years. Talk to your health care provider about how often you should have regular mammograms. Talk with your health care provider about your test results, treatment options, and if necessary, the need for more tests. Vaccines  Your health  care provider may recommend certain vaccines, such as:  Influenza vaccine. This is recommended every year.  Tetanus, diphtheria, and acellular pertussis (Tdap, Td) vaccine. You may need a Td booster every 10 years.  Zoster vaccine. You may need this after age 23.  Pneumococcal 13-valent  conjugate (PCV13) vaccine. One dose is recommended after age 79.  Pneumococcal polysaccharide (PPSV23) vaccine. One dose is recommended after age 70. Talk to your health care provider about which screenings and vaccines you need and how often you need them. This information is not intended to replace advice given to you by your health care provider. Make sure you discuss any questions you have with your health care provider. Document Released: 01/02/2016 Document Revised: 08/25/2016 Document Reviewed: 10/07/2015 Elsevier Interactive Patient Education  2017 [place] Prevention in the Home Falls can cause injuries. They can happen to people of all ages. There are many things you can do to make your home safe and to help prevent falls. What can I do on the outside of my home?  Regularly fix the edges of walkways and driveways and fix any cracks.  Remove anything that might make you trip as you walk through a door, such as a raised step or threshold.  Trim any bushes or trees on the path to your home.  Use bright outdoor lighting.  Clear any walking paths of anything that might make someone trip, such as rocks or tools.  Regularly check to see if handrails are loose or broken. Make sure that both sides of any steps have handrails.  Any raised decks and porches should have guardrails on the edges.  Have any leaves, snow, or ice cleared regularly.  Use sand or salt on walking paths during winter.  Clean up any spills in your garage right away. This includes oil or grease spills. What can I do in the bathroom?  Use night lights.  Install grab bars by the toilet and in the tub and shower. Do not use towel bars as grab bars.  Use non-skid mats or decals in the tub or shower.  If you need to sit down in the shower, use a plastic, non-slip stool.  Keep the floor dry. Clean up any water that spills on the floor as soon as it happens.  Remove soap buildup in the tub or  shower regularly.  Attach bath mats securely with double-sided non-slip rug tape.  Do not have throw rugs and other things on the floor that can make you trip. What can I do in the bedroom?  Use night lights.  Make sure that you have a light by your bed that is easy to reach.  Do not use any sheets or blankets that are too big for your bed. They should not hang down onto the floor.  Have a firm chair that has side arms. You can use this for support while you get dressed.  Do not have throw rugs and other things on the floor that can make you trip. What can I do in the kitchen?  Clean up any spills right away.  Avoid walking on wet floors.  Keep items that you use a lot in easy-to-reach places.  If you need to reach something above you, use a strong step stool that has a grab bar.  Keep electrical cords out of the way.  Do not use floor polish or wax that makes floors slippery. If you must use wax, use non-skid floor wax.  Do not have  throw rugs and other things on the floor that can make you trip. What can I do with my stairs?  Do not leave any items on the stairs.  Make sure that there are handrails on both sides of the stairs and use them. Fix handrails that are broken or loose. Make sure that handrails are as long as the stairways.  Check any carpeting to make sure that it is firmly attached to the stairs. Fix any carpet that is loose or worn.  Avoid having throw rugs at the top or bottom of the stairs. If you do have throw rugs, attach them to the floor with carpet tape.  Make sure that you have a light switch at the top of the stairs and the bottom of the stairs. If you do not have them, ask someone to add them for you. What else can I do to help prevent falls?  Wear shoes that:  Do not have high heels.  Have rubber bottoms.  Are comfortable and fit you well.  Are closed at the toe. Do not wear sandals.  If you use a stepladder:  Make sure that it is fully  opened. Do not climb a closed stepladder.  Make sure that both sides of the stepladder are locked into place.  Ask someone to hold it for you, if possible.  Clearly mark and make sure that you can see:  Any grab bars or handrails.  First and last steps.  Where the edge of each step is.  Use tools that help you move around (mobility aids) if they are needed. These include:  Canes.  Walkers.  Scooters.  Crutches.  Turn on the lights when you go into a dark area. Replace any light bulbs as soon as they burn out.  Set up your furniture so you have a clear path. Avoid moving your furniture around.  If any of your floors are uneven, fix them.  If there are any pets around you, be aware of where they are.  Review your medicines with your doctor. Some medicines can make you feel dizzy. This can increase your chance of falling. Ask your doctor what other things that you can do to help prevent falls. This information is not intended to replace advice given to you by your health care provider. Make sure you discuss any questions you have with your health care provider. Document Released: 10/02/2009 Document Revised: 05/13/2016 Document Reviewed: 01/10/2015 Elsevier Interactive Patient Education  2017 [employer].

## 2019-01-03 NOTE — Progress Notes (Signed)
Patient: Carol Kelly, Female    DOB: September 03, 1941, 78 y.o.   MRN: [id number] Visit Date: 01/03/2019  Today's Provider: [name], MD   Chief Complaint  Patient presents with  . Annual Exam   Subjective:  I, Presley Raddle, CMA, am acting as a scribe for Carol Latch, MD.   Patient had a AWE today prior to appointment   Complete Physical Carol Kelly is a 78 y.o. female. She feels well. She reports exercising none at this time, but plans to start soon. She reports she is sleeping fairly well. She states she goes to bed really late.   Does not want to repeat mammogram.  States she performs self breast exams and would get one if she found a lump -----------------------------------------------------------   Review of Systems  Constitutional: Negative.   HENT: Negative.   Eyes: Negative.   Respiratory: Negative.   Cardiovascular: Negative.   Gastrointestinal: Negative.   Endocrine: Negative.   Genitourinary: Negative.   Musculoskeletal: Negative.   Skin: Negative.   Allergic/Immunologic: Negative.   Neurological: Negative.   Hematological: Negative.   Psychiatric/Behavioral: Negative.     Social History   Socioeconomic History  . Marital status: Divorced    Spouse name: Not on file  . Number of children: 2  . Years of education: 16  . Highest education level: Bachelor's degree (e.g., BA, AB, BS)  Occupational History  . Not on file  Social Needs  . Financial resource strain: Not hard at all  . Food insecurity:    Worry: Never true    Inability: Never true  . Transportation needs:    Medical: No    Non-medical: No  Tobacco Use  . Smoking status: Former Smoker    Types: Cigarettes  . Smokeless tobacco: Never Used  . Tobacco comment: Smoked 10 years- not heavy, quit when pregnant with daughter  Substance and Sexual Activity  . Alcohol use: No    Alcohol/week: 0.0 standard drinks    Frequency: Never  . Drug use: No  . Sexual  activity: Yes  Lifestyle  . Physical activity:    Days per week: 0 days    Minutes per session: 0 min  . Stress: Not at all  Relationships  . Social connections:    Talks on phone: Patient refused    Gets together: Patient refused    Attends religious service: Patient refused    Active member of club or organization: Patient refused    Attends meetings of clubs or organizations: Patient refused    Relationship status: Patient refused  . Intimate partner violence:    Fear of current or ex partner: No    Emotionally abused: No    Physically abused: No    Forced sexual activity: No  Other Topics Concern  . Not on file  Social History Narrative   Currently in a relationship.    Past Medical History:  Diagnosis Date  . Allergy   . Cataract      Patient Active Problem List   Diagnosis Date Noted  . Allergic rhinitis 09/26/2015  . Absolute anemia 09/26/2015  . Body mass index (BMI) of 25.0-25.9 in adult 09/26/2015  . DD (diverticular disease) 09/26/2015  . Hot flash, menopausal 09/26/2015  . Hypercholesteremia 09/26/2015  . Osteopenia 09/26/2015  . Acne erythematosa 09/26/2015  . Avitaminosis D 09/26/2015  . Post-menopausal 08/19/2015    Past Surgical History:  Procedure Laterality Date  . ABDOMINAL HYSTERECTOMY    .  APPENDECTOMY    . BREAST BIOPSY Right 11/04/2014   neg  . BREAST CYST ASPIRATION Right 11/04/2014   benign  . DILATION AND CURETTAGE OF UTERUS    . EYE SURGERY Bilateral  left9/24/14   Cataract removed Right 07/18/2013  . OVARIAN CYST REMOVAL  1969  . SIGMOIDOSCOPY  1985    Her family history includes AAA (abdominal aortic aneurysm) in her father; Brain cancer in her brother; Colon polyps in her father; Dementia in her father; Diabetes in her mother; Heart disease in her brother; Transient ischemic attack in her father.      Current Outpatient Medications:  .  acetaminophen (TYLENOL) 500 MG tablet, Take 500 mg by mouth every 6 (six) hours as  needed., Disp: , Rfl:  .  Calcium Carb-Cholecalciferol (CALTRATE 600+D3 SOFT) 600-800 MG-UNIT CHEW, Chew by mouth 2 (two) times daily., Disp: , Rfl:  .  cetirizine (ZYRTEC) 10 MG tablet, Take 10 mg by mouth daily., Disp: , Rfl:  .  cetirizine-pseudoephedrine (ZYRTEC-D) 5-120 MG tablet, Take 1 tablet by mouth., Disp: , Rfl:  .  cycloSPORINE (RESTASIS) 0.05 % ophthalmic emulsion, Place 1 drop into both eyes 2 (two) times daily., Disp: , Rfl:  .  estradiol (ESTRACE) 0.5 MG tablet, Take 1 tablet (0.5 mg total) by mouth daily., Disp: 90 tablet, Rfl: 3 .  fluticasone (FLONASE) 50 MCG/ACT nasal spray, Place 2 sprays into both nostrils. , Disp: , Rfl:  .  Multiple Vitamins-Minerals (PRESERVISION AREDS 2 PO), Take by mouth 2 (two) times daily. , Disp: , Rfl:   Patient Care Team: Erasmo Downer, MD as PCP - General (Family Medicine) Lockie Mola, MD as Referring Physician (Ophthalmology) Linus Salmons, MD as Consulting Physician (Otolaryngology)     Objective:   Vitals: BP 112/80 (BP Location: Right Arm, Patient Position: Sitting, Cuff Size: Normal)   Pulse 90   Temp 97.9 F (36.6 C) (Oral)   Ht 5\' 5"  (1.651 m)   Wt 165 lb 3.2 oz (74.9 kg)   BMI 27.49 kg/m   Physical Exam Vitals signs reviewed.  Constitutional:      General: She is not in acute distress.    Appearance: Normal appearance. She is well-developed. She is not diaphoretic.  HENT:     Head: Normocephalic and atraumatic.     Right Ear: Tympanic membrane, ear canal and external ear normal.     Left Ear: Tympanic membrane, ear canal and external ear normal.     Nose: Nose normal.     Mouth/Throat:     Mouth: Mucous membranes are moist.     Pharynx: Oropharynx is clear. No oropharyngeal exudate.  Eyes:     General: No scleral icterus.    Conjunctiva/sclera: Conjunctivae normal.     Pupils: Pupils are equal, round, and reactive to light.  Neck:     Musculoskeletal: Neck supple.     Thyroid: No thyromegaly.    Cardiovascular:     Rate and Rhythm: Normal rate and regular rhythm.     Pulses: Normal pulses.     Heart sounds: Normal heart sounds. No murmur.  Pulmonary:     Effort: Pulmonary effort is normal. No respiratory distress.     Breath sounds: Normal breath sounds. No wheezing or rales.  Abdominal:     General: Bowel sounds are normal. There is no distension.     Palpations: Abdomen is soft.     Tenderness: There is no abdominal tenderness. There is no guarding or rebound.  Musculoskeletal:  General: No deformity.     Right lower leg: No edema.     Left lower leg: No edema.  Lymphadenopathy:     Cervical: No cervical adenopathy.  Skin:    General: Skin is warm and dry.     Capillary Refill: Capillary refill takes less than 2 seconds.     Findings: No rash.  Neurological:     Mental Status: She is alert and oriented to person, place, and time. Mental status is at baseline.  Psychiatric:        Mood and Affect: Mood normal.        Behavior: Behavior normal.        Thought Content: Thought content normal.     Activities of Daily Living In your present state of health, do you have any difficulty performing the following activities: 01/03/2019  Hearing? N  Vision? N  Difficulty concentrating or making decisions? N  Walking or climbing stairs? N  Dressing or bathing? N  Doing errands, shopping? N  Preparing Food and eating ? N  Using the Toilet? N  In the past six months, have you accidently leaked urine? Y  Comment Occasionally if does not empty bladder with first urge.   Do you have problems with loss of bowel control? N  Managing your Medications? N  Managing your Finances? N  Housekeeping or managing your Housekeeping? N  Some recent data might be hidden    Fall Risk Assessment Fall Risk  01/03/2019 01/02/2018 10/01/2016 09/30/2015  Falls in the past year? 0 No No No     Depression Screen PHQ 2/9 Scores 01/03/2019 01/02/2018 01/02/2018 10/01/2016  PHQ - 2 Score 0  0 0 0  PHQ- 9 Score - 1 - -    6CIT Screen 10/01/2016  What Year? 0 points  What month? 0 points  What time? 0 points  Count back from 20 0 points  Months in reverse 0 points  Repeat phrase 0 points  Total Score 0      Assessment & Plan:    Annual Physical Reviewed patient's Family Medical History Reviewed and updated list of patient's medical providers Assessment of cognitive impairment was done Assessed patient's functional ability Established a written schedule for health screening services Health Risk Assessent Completed and Reviewed  Exercise Activities and Dietary recommendations Goals    . Exercise 150 minutes per week (moderate activity)     Pt to start back walking 5 days a week for 30 minutes at a time.     . Reduce portion size     Starting 10/01/16, I will cut back on my portion sizes and fatty foods.       Immunization History  Administered Date(s) Administered  . Influenza,inj,Quad PF,6+ Mos 08/30/2014  . Influenza-Unspecified 09/15/2016, 09/23/2017, 10/13/2018  . Pneumococcal Conjugate-13 08/30/2014  . Pneumococcal Polysaccharide-23 06/27/2013, 06/27/2014  . Zoster 10/27/2011    Health Maintenance  Topic Date Due  . TETANUS/TDAP  12/29/2021 (Originally 08/26/1960)  . DEXA SCAN  10/19/2020  . INFLUENZA VACCINE  Completed  . PNA vac Low Risk Adult  Completed     Discussed health benefits of physical activity, and encouraged her to engage in regular exercise appropriate for her age and condition.    ------------------------------------------------------------------------------------------------------------  Problem List Items Addressed This Visit      Other   Post-menopausal   Relevant Medications   estradiol (ESTRACE) 0.5 MG tablet   Absolute anemia   Hypercholesteremia   Relevant Orders   Lipid panel  Comprehensive metabolic panel    Other Visit Diagnoses    Encounter for annual physical exam    -  Primary       Return in about  1 year (around 01/04/2020) for AWV/CPE.   The entirety of the information documented in the History of Present Illness, Review of Systems and Physical Exam were personally obtained by me. Portions of this information were initially documented by Presley RaddleNikki Walston, CMA and reviewed by me for thoroughness and accuracy.    Erasmo DownerBacigalupo, Angela M, MD, MPH [facility] 01/03/2019 10:32 AM

## 2019-01-03 NOTE — Progress Notes (Signed)
Subjective:   Carol Kelly is a 78 y.o. female who presents for Medicare Annual (Subsequent) preventive examination.  Review of Systems:  N/A  Cardiac Risk Factors include: advanced age (>75men, >39 women)     Objective:     Vitals: BP 112/80 (BP Location: Right Arm)   Pulse 90   Temp 97.9 F (36.6 C) (Oral)   Ht 5\' 5"  (1.651 m)   Wt 165 lb 3.2 oz (74.9 kg)   BMI 27.49 kg/m   Body mass index is 27.49 kg/m.  Advanced Directives 01/03/2019 01/02/2018 10/01/2016 09/30/2015  Does Patient Have a Medical Advance Directive? Yes Yes Yes Yes  Type of Estate agent of [place];Living will Living will;Healthcare Power of [address] - Healthcare Power of [place];Living will  Copy of Healthcare Power of Attorney in Chart? Yes - validated most recent copy scanned in chart (See row information) Yes Yes -    Tobacco Social History   Tobacco Use  Smoking Status Former Smoker  . Types: Cigarettes  Smokeless Tobacco Never Used  Tobacco Comment   Smoked 10 years- not heavy, quit when pregnant with daughter     Counseling given: Not Answered Comment: Smoked 10 years- not heavy, quit when pregnant with daughter   Clinical Intake:  Pre-visit preparation completed: Yes  Pain : No/denies pain Pain Score: 0-No pain     Nutritional Status: BMI 25 -29 Overweight Nutritional Risks: None Diabetes: No  How often do you need to have someone help you when you read instructions, pamphlets, or other written materials from your doctor or pharmacy?: 1 - Never  Interpreter Needed?: No  Information entered by :: [facility], LPN  Past Medical History:  Diagnosis Date  . Allergy   . Cataract    Past Surgical History:  Procedure Laterality Date  . ABDOMINAL HYSTERECTOMY    . APPENDECTOMY    . BREAST BIOPSY Right 11/04/2014   neg  . BREAST CYST ASPIRATION Right 11/04/2014   benign  . DILATION AND CURETTAGE OF UTERUS    . EYE SURGERY Bilateral  left9/24/14   Cataract removed Right 07/18/2013  . OVARIAN CYST REMOVAL  1969  . SIGMOIDOSCOPY  1985   Family History  Problem Relation Age of Onset  . Colon polyps Father   . Dementia Father   . Transient ischemic attack Father   . AAA (abdominal aortic aneurysm) Father   . Diabetes Mother   . Heart disease Brother   . Brain cancer Brother        had tumor removed   Social History   Socioeconomic History  . Marital status: Divorced    Spouse name: Not on file  . Number of children: 2  . Years of education: 16  . Highest education level: Bachelor's degree (e.g., BA, AB, BS)  Occupational History  . Not on file  Social Needs  . Financial resource strain: Not hard at all  . Food insecurity:    Worry: Never true    Inability: Never true  . Transportation needs:    Medical: No    Non-medical: No  Tobacco Use  . Smoking status: Former Smoker    Types: Cigarettes  . Smokeless tobacco: Never Used  . Tobacco comment: Smoked 10 years- not heavy, quit when pregnant with daughter  Substance and Sexual Activity  . Alcohol use: No    Alcohol/week: 0.0 standard drinks    Frequency: Never  . Drug use: No  . Sexual activity: Yes  Lifestyle  .  Physical activity:    Days per week: 0 days    Minutes per session: 0 min  . Stress: Not at all  Relationships  . Social connections:    Talks on phone: Patient refused    Gets together: Patient refused    Attends religious service: Patient refused    Active member of club or organization: Patient refused    Attends meetings of clubs or organizations: Patient refused    Relationship status: Patient refused  Other Topics Concern  . Not on file  Social History Narrative   Currently in a relationship.    Outpatient Encounter Medications as of 01/03/2019  Medication Sig  . acetaminophen (TYLENOL) 500 MG tablet Take 500 mg by mouth every 6 (six) hours as needed.  . Calcium Carb-Cholecalciferol (CALTRATE 600+D3 SOFT) 600-800 MG-UNIT CHEW Chew by  mouth 2 (two) times daily.  . cetirizine (ZYRTEC) 10 MG tablet Take 10 mg by mouth daily.  . cetirizine-pseudoephedrine (ZYRTEC-D) 5-120 MG tablet Take 1 tablet by mouth.  . cycloSPORINE (RESTASIS) 0.05 % ophthalmic emulsion Place 1 drop into both eyes 2 (two) times daily.  Marland Kitchen estradiol (ESTRACE) 0.5 MG tablet Take 1 tablet (0.5 mg total) by mouth daily.  . Multiple Vitamins-Minerals (PRESERVISION AREDS 2 PO) Take by mouth 2 (two) times daily.   . MULTIPLE VITAMINS-MINERALS PO Take 1 tablet by mouth daily.  . fluticasone (FLONASE) 50 MCG/ACT nasal spray Place 2 sprays into both nostrils.    No facility-administered encounter medications on file as of 01/03/2019.     Activities of Daily Living In your present state of health, do you have any difficulty performing the following activities: 01/03/2019  Hearing? N  Vision? N  Difficulty concentrating or making decisions? N  Walking or climbing stairs? N  Dressing or bathing? N  Doing errands, shopping? N  Preparing Food and eating ? N  Using the Toilet? N  In the past six months, have you accidently leaked urine? Y  Comment Occasionally if does not empty bladder with first urge.   Do you have problems with loss of bowel control? N  Managing your Medications? N  Managing your Finances? N  Housekeeping or managing your Housekeeping? N  Some recent data might be hidden    Patient Care Team: Erasmo Downer, MD as PCP - General (Family Medicine) Lockie Mola, MD as Referring Physician (Ophthalmology) Linus Salmons, MD as Consulting Physician (Otolaryngology)    Assessment:   This is a routine wellness examination for Carol Kelly.  Exercise Activities and Dietary recommendations Current Exercise Habits: The patient does not participate in regular exercise at present, Exercise limited by: None identified  Goals    . Exercise 150 minutes per week (moderate activity)     Pt to start back walking 5 days a week for 30 minutes at  a time.     . Reduce portion size     Starting 10/01/16, I will cut back on my portion sizes and fatty foods.       Fall Risk Fall Risk  01/03/2019 01/02/2018 10/01/2016 09/30/2015  Falls in the past year? 0 No No No   FALL RISK PREVENTION PERTAINING TO THE HOME:  Any stairs in or around the home WITH handrails? Yes  Home free of loose throw rugs in walkways, pet beds, electrical cords, etc? Yes  Adequate lighting in your home to reduce risk of falls? Yes   ASSISTIVE DEVICES UTILIZED TO PREVENT FALLS:  Life alert? No  Use of a cane,  walker or w/c? No  Grab bars in the bathroom? No  Shower chair or bench in shower? No  Elevated toilet seat or a handicapped toilet? No    TIMED UP AND GO:  Was the test performed? No .     Depression Screen PHQ 2/9 Scores 01/03/2019 01/02/2018 01/02/2018 10/01/2016  PHQ - 2 Score 0 0 0 0  PHQ- 9 Score - 1 - -     Cognitive Function: Declined today.     6CIT Screen 10/01/2016  What Year? 0 points  What month? 0 points  What time? 0 points  Count back from 20 0 points  Months in reverse 0 points  Repeat phrase 0 points  Total Score 0    Immunization History  Administered Date(s) Administered  . Influenza,inj,Quad PF,6+ Mos 08/30/2014  . Influenza-Unspecified 09/15/2016, 09/23/2017, 10/13/2018  . Pneumococcal Conjugate-13 08/30/2014  . Pneumococcal Polysaccharide-23 06/27/2013, 06/27/2014  . Zoster 10/27/2011    Qualifies for Shingles Vaccine? Yes  Zostavax completed 10/27/11. Due for Shingrix. Education has been provided regarding the importance of this vaccine. Pt has been advised to call insurance company to determine out of pocket expense. Advised may also receive vaccine at local pharmacy or Health Dept. Verbalized acceptance and understanding.  Tdap: Although this vaccine is not a covered service during a Wellness Exam, does the patient still wish to receive this vaccine today?  No .  Education has been provided regarding the  importance of this vaccine. Advised may receive this vaccine at local pharmacy or Health Dept. Aware to provide a copy of the vaccination record if obtained from local pharmacy or Health Dept. Verbalized acceptance and understanding.  Flu Vaccine: Up to date  Pneumococcal Vaccine: Up to date   Screening Tests Health Maintenance  Topic Date Due  . TETANUS/TDAP  12/29/2021 (Originally 08/26/1960)  . DEXA SCAN  10/19/2020  . INFLUENZA VACCINE  Completed  . PNA vac Low Risk Adult  Completed    Cancer Screenings:  Colorectal Screening: No longer required.   Mammogram: No longer required.   Bone Density: Completed 10/20/15. Results reflect OSTEOPENIA. Repeat every 5 years.   Lung Cancer Screening: (Low Dose CT Chest recommended if Age 77-80 years, 30 pack-year currently smoking OR have quit w/in 15years.) does not qualify.    Additional Screening:  Vision Screening: Recommended annual ophthalmology exams for early detection of glaucoma and other disorders of the eye.  Dental Screening: Recommended annual dental exams for proper oral hygiene  Community Resource Referral:  CRR required this visit?  No       Plan:  I have personally reviewed and addressed the Medicare Annual Wellness questionnaire and have noted the following in the patient's chart:  A. Medical and social history B. Use of alcohol, tobacco or illicit drugs  C. Current medications and supplements D. Functional ability and status E.  Nutritional status F.  Physical activity G. Advance directives H. List of other physicians I.  Hospitalizations, surgeries, and ER visits in previous 12 months J.  Vitals K. Screenings such as hearing and vision if needed, cognitive and depression L. Referrals and appointments - none  In addition, I have reviewed and discussed with patient certain preventive protocols, quality metrics, and best practice recommendations. A written personalized care plan for preventive services as  well as general preventive health recommendations were provided to patient.  See attached scanned questionnaire for additional information.   Signed,  Hyacinth MeekerMckenzie Wyvonne Carda, LPN Nurse Health Advisor   Nurse Recommendations: Pt declined  the tetanus vaccine today.

## 2019-01-04 ENCOUNTER — Telehealth: Payer: Self-pay

## 2019-01-04 DIAGNOSIS — E875 Hyperkalemia: Secondary | ICD-10-CM

## 2019-01-04 LAB — CBC WITH DIFFERENTIAL/PLATELET
BASOS ABS: 0.1 10*3/uL (ref 0.0–0.2)
Basos: 1 %
EOS (ABSOLUTE): 0.5 10*3/uL — AB (ref 0.0–0.4)
Eos: 9 %
HEMOGLOBIN: 12 g/dL (ref 11.1–15.9)
Hematocrit: 35.2 % (ref 34.0–46.6)
Immature Grans (Abs): 0 10*3/uL (ref 0.0–0.1)
Immature Granulocytes: 0 %
LYMPHS ABS: 1.9 10*3/uL (ref 0.7–3.1)
Lymphs: 30 %
MCH: 31.2 pg (ref 26.6–33.0)
MCHC: 34.1 g/dL (ref 31.5–35.7)
MCV: 91 fL (ref 79–97)
Monocytes Absolute: 0.7 10*3/uL (ref 0.1–0.9)
Monocytes: 11 %
NEUTROS ABS: 3.1 10*3/uL (ref 1.4–7.0)
Neutrophils: 49 %
PLATELETS: 291 10*3/uL (ref 150–450)
RBC: 3.85 x10E6/uL (ref 3.77–5.28)
RDW: 11.8 % (ref 11.7–15.4)
WBC: 6.3 10*3/uL (ref 3.4–10.8)

## 2019-01-04 LAB — LIPID PANEL
CHOL/HDL RATIO: 3.9 ratio (ref 0.0–4.4)
Cholesterol, Total: 272 mg/dL — ABNORMAL HIGH (ref 100–199)
HDL: 70 mg/dL (ref >39)
LDL Calculated: 168 mg/dL — ABNORMAL HIGH (ref 0–99)
Triglycerides: 172 mg/dL — ABNORMAL HIGH (ref 0–149)
VLDL CHOLESTEROL CAL: 34 mg/dL (ref 5–40)

## 2019-01-04 LAB — COMPREHENSIVE METABOLIC PANEL
A/G RATIO: 1.8 (ref 1.2–2.2)
ALBUMIN: 4.6 g/dL (ref 3.5–4.8)
ALK PHOS: 74 IU/L (ref 39–117)
ALT: 12 IU/L (ref 0–32)
AST: 15 IU/L (ref 0–40)
BILIRUBIN TOTAL: 0.4 mg/dL (ref 0.0–1.2)
BUN / CREAT RATIO: 16 (ref 12–28)
BUN: 14 mg/dL (ref 8–27)
CHLORIDE: 98 mmol/L (ref 96–106)
CO2: 24 mmol/L (ref 20–29)
Calcium: 9.8 mg/dL (ref 8.7–10.3)
Creatinine, Ser: 0.85 mg/dL (ref 0.57–1.00)
GFR calc non Af Amer: 67 mL/min/{1.73_m2} (ref >59)
GFR, EST AFRICAN AMERICAN: 77 mL/min/{1.73_m2} (ref >59)
GLUCOSE: 100 mg/dL — AB (ref 65–99)
Globulin, Total: 2.5 g/dL (ref 1.5–4.5)
POTASSIUM: 5.3 mmol/L — AB (ref 3.5–5.2)
Sodium: 139 mmol/L (ref 134–144)
TOTAL PROTEIN: 7.1 g/dL (ref 6.0–8.5)

## 2019-01-04 NOTE — Telephone Encounter (Signed)
-----   Message from Erasmo Downer, MD sent at 01/04/2019 11:27 AM EST ----- Cholesterol is elevated and increased from last year.  10 year risk of heart disease/stroke is high at 14.4%.  Above 7.5%, you should consider a statin medicine to decrease heart disease and stroke risk.  I'd recommend trying a statin. Potassium is high.  Is patient taking any supplements or eating high potassium foods?  Can we add on an A1c for high blood glucose.  Normal Blood counts

## 2019-01-04 NOTE — Telephone Encounter (Signed)
Patient advised. She declined statin therapy at this time. She states she will work on lifestyle changes. She denies taking any potassium supplements/ eating high potassium foods.   Lab add on form faxed to [employer] [employer].

## 2019-01-04 NOTE — Telephone Encounter (Signed)
Let's recheck potassium in 1 week

## 2019-01-04 NOTE — Telephone Encounter (Signed)
Patient advised. Lab slip printed at the front desk 

## 2019-01-06 LAB — HGB A1C W/O EAG: HEMOGLOBIN A1C: 5.4 % (ref 4.8–5.6)

## 2019-01-08 ENCOUNTER — Telehealth: Payer: Self-pay

## 2019-01-08 NOTE — Telephone Encounter (Signed)
Patient was advised.  

## 2019-01-08 NOTE — Telephone Encounter (Signed)
-----   Message from Erasmo Downer, MD sent at 01/08/2019  8:37 AM EST ----- Normal A1c.  No prediabetes

## 2019-01-12 LAB — COMPREHENSIVE METABOLIC PANEL
ALT: 11 IU/L (ref 0–32)
AST: 12 IU/L (ref 0–40)
Albumin/Globulin Ratio: 2 (ref 1.2–2.2)
Albumin: 4.5 g/dL (ref 3.7–4.7)
Alkaline Phosphatase: 64 IU/L (ref 39–117)
BILIRUBIN TOTAL: 0.4 mg/dL (ref 0.0–1.2)
BUN/Creatinine Ratio: 13 (ref 12–28)
BUN: 11 mg/dL (ref 8–27)
CO2: 21 mmol/L (ref 20–29)
Calcium: 9.3 mg/dL (ref 8.7–10.3)
Chloride: 102 mmol/L (ref 96–106)
Creatinine, Ser: 0.82 mg/dL (ref 0.57–1.00)
GFR calc Af Amer: 80 mL/min/{1.73_m2} (ref >59)
GFR calc non Af Amer: 70 mL/min/{1.73_m2} (ref >59)
Globulin, Total: 2.2 g/dL (ref 1.5–4.5)
Glucose: 99 mg/dL (ref 65–99)
Potassium: 3.9 mmol/L (ref 3.5–5.2)
Sodium: 140 mmol/L (ref 134–144)
Total Protein: 6.7 g/dL (ref 6.0–8.5)

## 2019-12-11 ENCOUNTER — Ambulatory Visit: Payer: Medicare Other | Attending: Internal Medicine

## 2019-12-11 ENCOUNTER — Other Ambulatory Visit: Payer: Medicare Other

## 2019-12-11 DIAGNOSIS — Z20822 Contact with and (suspected) exposure to covid-19: Secondary | ICD-10-CM

## 2019-12-13 LAB — NOVEL CORONAVIRUS, NAA: SARS-CoV-2, NAA: NOT DETECTED

## 2020-01-03 DIAGNOSIS — H353132 Nonexudative age-related macular degeneration, bilateral, intermediate dry stage: Secondary | ICD-10-CM | POA: Diagnosis not present

## 2020-01-03 NOTE — Progress Notes (Signed)
Subjective:   Carol Kelly is a 79 y.o. female who presents for Medicare Annual (Subsequent) preventive examination.  Review of Systems:  N/A  Cardiac Risk Factors include: advanced age (>27men, >16 women)     Objective:     Vitals: BP 136/70 (BP Location: Right Arm)   Pulse 79   Temp (!) 97.3 F (36.3 C) (Tympanic)   Ht 5\' 5"  (1.651 m)   Wt 170 lb 12.8 oz (77.5 kg)   BMI 28.42 kg/m   Body mass index is 28.42 kg/m.  Advanced Directives 01/07/2020 01/03/2019 01/02/2018 10/01/2016 09/30/2015  Does Patient Have a Medical Advance Directive? Yes Yes Yes Yes Yes  Type of 11/30/2015 of [place];Living will Healthcare Power of [place];Living will Living will;Healthcare Power of [place] - Healthcare Power of [place];Living will  Copy of Healthcare Power of Attorney in Chart? Yes - validated most recent copy scanned in chart (See row information) Yes - validated most recent copy scanned in chart (See row information) Yes Yes -    Tobacco Social History   Tobacco Use  Smoking Status Former Smoker  . Types: Cigarettes  Smokeless Tobacco Never Used  Tobacco Comment   Smoked 10 years- not heavy, quit when pregnant with daughter     Counseling given: Not Answered Comment: Smoked 10 years- not heavy, quit when pregnant with daughter   Clinical Intake:  Pre-visit preparation completed: Yes  Pain : No/denies pain Pain Score: 0-No pain     Nutritional Status: BMI 25 -29 Overweight Nutritional Risks: None Diabetes: No  How often do you need to have someone help you when you read instructions, pamphlets, or other written materials from your doctor or pharmacy?: 1 - Never  Interpreter Needed?: No  Information entered by :: [facility], LPN  Past Medical History:  Diagnosis Date  . Allergy   . Cataract    Past Surgical History:  Procedure Laterality Date  . ABDOMINAL HYSTERECTOMY    . APPENDECTOMY    . BREAST BIOPSY Right 11/04/2014   neg  . BREAST CYST ASPIRATION Right 11/04/2014   benign  . DILATION AND CURETTAGE OF UTERUS    . EYE SURGERY Bilateral  left9/24/14   Cataract removed Right 07/18/2013  . OVARIAN CYST REMOVAL  1969  . SIGMOIDOSCOPY  1985   Family History  Problem Relation Age of Onset  . Colon polyps Father   . Dementia Father   . Transient ischemic attack Father   . AAA (abdominal aortic aneurysm) Father   . Diabetes Mother   . Heart disease Brother   . Brain cancer Brother        had tumor removed   Social History   Socioeconomic History  . Marital status: Divorced    Spouse name: Not on file  . Number of children: 2  . Years of education: 16  . Highest education level: Bachelor's degree (e.g., BA, AB, BS)  Occupational History  . Not on file  Tobacco Use  . Smoking status: Former Smoker    Types: Cigarettes  . Smokeless tobacco: Never Used  . Tobacco comment: Smoked 10 years- not heavy, quit when pregnant with daughter  Substance and Sexual Activity  . Alcohol use: No    Alcohol/week: 0.0 standard drinks  . Drug use: No  . Sexual activity: Yes  Other Topics Concern  . Not on file  Social History Narrative   Currently in a relationship.   Social Determinants of Health   Financial Resource Strain:  Low Risk   . Difficulty of Paying Living Expenses: Not hard at all  Food Insecurity: No Food Insecurity  . Worried About Programme researcher, broadcasting/film/video in the Last Year: Never true  . Ran Out of Food in the Last Year: Never true  Transportation Needs: No Transportation Needs  . Lack of Transportation (Medical): No  . Lack of Transportation (Non-Medical): No  Physical Activity: Inactive  . Days of Exercise per Week: 0 days  . Minutes of Exercise per Session: 0 min  Stress: No Stress Concern Present  . Feeling of Stress : Not at all  Social Connections: Slightly Isolated  . Frequency of Communication with Friends and Family: More than three times a week  . Frequency of Social Gatherings with  Friends and Family: More than three times a week  . Attends Religious Services: More than 4 times per year  . Active Member of Clubs or Organizations: Yes  . Attends Banker Meetings: More than 4 times per year  . Marital Status: Divorced    Outpatient Encounter Medications as of 01/07/2020  Medication Sig  . acetaminophen (TYLENOL) 500 MG tablet Take 500 mg by mouth every 6 (six) hours as needed.  . Calcium Carb-Cholecalciferol (CALTRATE 600+D3 SOFT) 600-800 MG-UNIT CHEW Chew by mouth 2 (two) times daily.  . cetirizine (ZYRTEC) 10 MG tablet Take 10 mg by mouth daily.  . cycloSPORINE (RESTASIS) 0.05 % ophthalmic emulsion Place 1 drop into both eyes 2 (two) times daily.  Marland Kitchen estradiol (ESTRACE) 0.5 MG tablet Take 1 tablet (0.5 mg total) by mouth daily.  . Multiple Vitamin (MULTIVITAMIN) capsule Take 1 capsule by mouth daily.  . Multiple Vitamins-Minerals (PRESERVISION AREDS 2 PO) Take by mouth 2 (two) times daily.   . cetirizine-pseudoephedrine (ZYRTEC-D) 5-120 MG tablet Take 1 tablet by mouth.  . fluticasone (FLONASE) 50 MCG/ACT nasal spray Place 2 sprays into both nostrils.    No facility-administered encounter medications on file as of 01/07/2020.    Activities of Daily Living In your present state of health, do you have any difficulty performing the following activities: 01/07/2020  Hearing? N  Vision? N  Difficulty concentrating or making decisions? N  Walking or climbing stairs? N  Dressing or bathing? N  Doing errands, shopping? N  Preparing Food and eating ? N  Using the Toilet? N  In the past six months, have you accidently leaked urine? Y  Comment Occassionally when seating for a while.  Do you have problems with loss of bowel control? N  Managing your Medications? N  Managing your Finances? N  Housekeeping or managing your Housekeeping? N  Some recent data might be hidden    Patient Care Team: Erasmo Downer, MD as PCP - General (Family  Medicine) Lockie Mola, MD as Referring Physician (Ophthalmology) Linus Salmons, MD as Consulting Physician (Otolaryngology)    Assessment:   This is a routine wellness examination for Carol Kelly.  Exercise Activities and Dietary recommendations Current Exercise Habits: The patient does not participate in regular exercise at present, Exercise limited by: None identified  Goals    . Exercise 150 minutes per week (moderate activity)     Pt to start back walking 5 days a week for 30 minutes at a time.     . Reduce portion size     Starting 10/01/16, I will cut back on my portion sizes and fatty foods.       Fall Risk: Fall Risk  01/07/2020 01/03/2019 01/02/2018 10/01/2016 09/30/2015  Falls in the past year? 0 0 No No No  Number falls in past yr: 0 - - - -  Injury with Fall? 0 - - - -    FALL RISK PREVENTION PERTAINING TO THE HOME:  Any stairs in or around the home? Yes  If so, are there any without handrails? No   Home free of loose throw rugs in walkways, pet beds, electrical cords, etc? Yes  Adequate lighting in your home to reduce risk of falls? Yes   ASSISTIVE DEVICES UTILIZED TO PREVENT FALLS:  Life alert? No  Use of a cane, walker or w/c? No  Grab bars in the bathroom? No  Shower chair or bench in shower? No  Elevated toilet seat or a handicapped toilet? Yes    TIMED UP AND GO:  Was the test performed? No .    Depression Screen PHQ 2/9 Scores 01/07/2020 01/07/2020 01/03/2019 01/02/2018  PHQ - 2 Score 0 0 0 0  PHQ- 9 Score - - - 1     Cognitive Function: Declined today.      6CIT Screen 10/01/2016  What Year? 0 points  What month? 0 points  What time? 0 points  Count back from 20 0 points  Months in reverse 0 points  Repeat phrase 0 points  Total Score 0    Immunization History  Administered Date(s) Administered  . Influenza,inj,Quad PF,6+ Mos 08/30/2014, 09/26/2019  . Influenza-Unspecified 09/15/2016, 09/23/2017, 10/13/2018  . Pneumococcal  Conjugate-13 08/30/2014  . Pneumococcal Polysaccharide-23 06/27/2013, 06/27/2014  . Tdap 04/04/2012  . Zoster 10/27/2011    Qualifies for Shingles Vaccine? Yes  Zostavax completed 10/27/11. Due for Shingrix. Pt has been advised to call insurance company to determine out of pocket expense. Advised may also receive vaccine at local pharmacy or Health Dept. Verbalized acceptance and understanding.  Tdap: Up to date  Flu Vaccine: Up to date  Pneumococcal Vaccine: Completed series  Screening Tests Health Maintenance  Topic Date Due  . DEXA SCAN  10/19/2020  . TETANUS/TDAP  04/04/2022  . INFLUENZA VACCINE  Completed  . PNA vac Low Risk Adult  Completed    Cancer Screenings:  Colorectal Screening: No longer required.   Mammogram: No longer required.   Bone Density: Completed 10/20/15. Results reflect OSTEOPENIA. Repeat every 5 years.  Lung Cancer Screening: (Low Dose CT Chest recommended if Age 10-80 years, 30 pack-year currently smoking OR have quit w/in 15years.) does not qualify.   Additional Screening:  Vision Screening: Recommended annual ophthalmology exams for early detection of glaucoma and other disorders of the eye.  Dental Screening: Recommended annual dental exams for proper oral hygiene  Community Resource Referral:  CRR required this visit?  No       Plan:  I have personally reviewed and addressed the Medicare Annual Wellness questionnaire and have noted the following in the patient's chart:  A. Medical and social history B. Use of alcohol, tobacco or illicit drugs  C. Current medications and supplements D. Functional ability and status E.  Nutritional status F.  Physical activity G. Advance directives H. List of other physicians I.  Hospitalizations, surgeries, and ER visits in previous 12 months J.  Vitals K. Screenings such as hearing and vision if needed, cognitive and depression L. Referrals and appointments   In addition, I have reviewed and  discussed with patient certain preventive protocols, quality metrics, and best practice recommendations. A written personalized care plan for preventive services as well as general preventive health recommendations were provided  to patient. Nurse Health Advisor  Signed,    Daden Mahany [place], [place]  09/08/1659 Nurse Health Advisor   Nurse Notes: None.

## 2020-01-07 ENCOUNTER — Encounter: Payer: Self-pay | Admitting: Family Medicine

## 2020-01-07 ENCOUNTER — Ambulatory Visit (INDEPENDENT_AMBULATORY_CARE_PROVIDER_SITE_OTHER): Payer: Medicare PPO

## 2020-01-07 ENCOUNTER — Other Ambulatory Visit: Payer: Self-pay

## 2020-01-07 ENCOUNTER — Ambulatory Visit (INDEPENDENT_AMBULATORY_CARE_PROVIDER_SITE_OTHER): Payer: Medicare PPO | Admitting: Family Medicine

## 2020-01-07 VITALS — BP 136/70 | HR 79 | Temp 97.3°F | Ht 65.0 in | Wt 170.8 lb

## 2020-01-07 DIAGNOSIS — E7800 Pure hypercholesterolemia, unspecified: Secondary | ICD-10-CM

## 2020-01-07 DIAGNOSIS — E559 Vitamin D deficiency, unspecified: Secondary | ICD-10-CM | POA: Diagnosis not present

## 2020-01-07 DIAGNOSIS — E663 Overweight: Secondary | ICD-10-CM | POA: Diagnosis not present

## 2020-01-07 DIAGNOSIS — Z862 Personal history of diseases of the blood and blood-forming organs and certain disorders involving the immune mechanism: Secondary | ICD-10-CM | POA: Diagnosis not present

## 2020-01-07 DIAGNOSIS — M8589 Other specified disorders of bone density and structure, multiple sites: Secondary | ICD-10-CM

## 2020-01-07 DIAGNOSIS — Z1231 Encounter for screening mammogram for malignant neoplasm of breast: Secondary | ICD-10-CM | POA: Diagnosis not present

## 2020-01-07 DIAGNOSIS — Z0000 Encounter for general adult medical examination without abnormal findings: Secondary | ICD-10-CM | POA: Diagnosis not present

## 2020-01-07 DIAGNOSIS — R739 Hyperglycemia, unspecified: Secondary | ICD-10-CM

## 2020-01-07 NOTE — Patient Instructions (Signed)
Carol Kelly , Thank you for taking time to come for your Medicare Wellness Visit. I appreciate your ongoing commitment to your health goals. Please review the following plan we discussed and let me know if I can assist you in the future.   Screening recommendations/referrals: Colonoscopy: No longer required.  Mammogram: No longer required.  Bone Density: Up to date, due 09/2020 Recommended yearly ophthalmology/optometry visit for glaucoma screening and checkup Recommended yearly dental visit for hygiene and checkup  Vaccinations: Influenza vaccine: Up to date Pneumococcal vaccine: Completed series Tdap vaccine: Up to date, due 03/2022 Shingles vaccine: Pt declines today.     Advanced directives: Currently on file.   Conditions/risks identified: Recommend to start back walking 5 days a week for at least 30 minutes at a time. Continue to monitor fatty food intake and eat healthy snacks.   Next appointment: 10:40 AM today with Dr Beryle Flock.    Preventive Care 22 Years and Older, Female Preventive care refers to lifestyle choices and visits with your health care provider that can promote health and wellness. What does preventive care include?  A yearly physical exam. This is also called an annual well check.  Dental exams once or twice a year.  Routine eye exams. Ask your health care provider how often you should have your eyes checked.  Personal lifestyle choices, including:  Daily care of your teeth and gums.  Regular physical activity.  Eating a healthy diet.  Avoiding tobacco and drug use.  Limiting alcohol use.  Practicing safe sex.  Taking low-dose aspirin every day.  Taking vitamin and mineral supplements as recommended by your health care provider. What happens during an annual well check? The services and screenings done by your health care provider during your annual well check will depend on your age, overall health, lifestyle risk factors, and family  history of disease. Counseling  Your health care provider may ask you questions about your:  Alcohol use.  Tobacco use.  Drug use.  Emotional well-being.  Home and relationship well-being.  Sexual activity.  Eating habits.  History of falls.  Memory and ability to understand (cognition).  Work and work Astronomer.  Reproductive health. Screening  You may have the following tests or measurements:  Height, weight, and BMI.  Blood pressure.  Lipid and cholesterol levels. These may be checked every 5 years, or more frequently if you are over 18 years old.  Skin check.  Lung cancer screening. You may have this screening every year starting at age 49 if you have a 30-pack-year history of smoking and currently smoke or have quit within the past 15 years.  Fecal occult blood test (FOBT) of the stool. You may have this test every year starting at age 70.  Flexible sigmoidoscopy or colonoscopy. You may have a sigmoidoscopy every 5 years or a colonoscopy every 10 years starting at age 47.  Hepatitis C blood test.  Hepatitis B blood test.  Sexually transmitted disease (STD) testing.  Diabetes screening. This is done by checking your blood sugar (glucose) after you have not eaten for a while (fasting). You may have this done every 1-3 years.  Bone density scan. This is done to screen for osteoporosis. You may have this done starting at age 97.  Mammogram. This may be done every 1-2 years. Talk to your health care provider about how often you should have regular mammograms. Talk with your health care provider about your test results, treatment options, and if necessary, the need for more  tests. Vaccines  Your health care provider may recommend certain vaccines, such as:  Influenza vaccine. This is recommended every year.  Tetanus, diphtheria, and acellular pertussis (Tdap, Td) vaccine. You may need a Td booster every 10 years.  Zoster vaccine. You may need this after  age 33.  Pneumococcal 13-valent conjugate (PCV13) vaccine. One dose is recommended after age 76.  Pneumococcal polysaccharide (PPSV23) vaccine. One dose is recommended after age 65. Talk to your health care provider about which screenings and vaccines you need and how often you need them. This information is not intended to replace advice given to you by your health care provider. Make sure you discuss any questions you have with your health care provider. Document Released: 01/02/2016 Document Revised: 08/25/2016 Document Reviewed: 10/07/2015 Elsevier Interactive Patient Education  2017 [employer] Prevention in the Home Falls can cause injuries. They can happen to people of all ages. There are many things you can do to make your home safe and to help prevent falls. What can I do on the outside of my home?  Regularly fix the edges of walkways and driveways and fix any cracks.  Remove anything that might make you trip as you walk through a door, such as a raised step or threshold.  Trim any bushes or trees on the path to your home.  Use bright outdoor lighting.  Clear any walking paths of anything that might make someone trip, such as rocks or tools.  Regularly check to see if handrails are loose or broken. Make sure that both sides of any steps have handrails.  Any raised decks and porches should have guardrails on the edges.  Have any leaves, snow, or ice cleared regularly.  Use sand or salt on walking paths during winter.  Clean up any spills in your garage right away. This includes oil or grease spills. What can I do in the bathroom?  Use night lights.  Install grab bars by the toilet and in the tub and shower. Do not use towel bars as grab bars.  Use non-skid mats or decals in the tub or shower.  If you need to sit down in the shower, use a plastic, non-slip stool.  Keep the floor dry. Clean up any water that spills on the floor as soon as it  happens.  Remove soap buildup in the tub or shower regularly.  Attach bath mats securely with double-sided non-slip rug tape.  Do not have throw rugs and other things on the floor that can make you trip. What can I do in the bedroom?  Use night lights.  Make sure that you have a light by your bed that is easy to reach.  Do not use any sheets or blankets that are too big for your bed. They should not hang down onto the floor.  Have a firm chair that has side arms. You can use this for support while you get dressed.  Do not have throw rugs and other things on the floor that can make you trip. What can I do in the kitchen?  Clean up any spills right away.  Avoid walking on wet floors.  Keep items that you use a lot in easy-to-reach places.  If you need to reach something above you, use a strong step stool that has a grab bar.  Keep electrical cords out of the way.  Do not use floor polish or wax that makes floors slippery. If you must use wax, use non-skid floor  wax.  Do not have throw rugs and other things on the floor that can make you trip. What can I do with my stairs?  Do not leave any items on the stairs.  Make sure that there are handrails on both sides of the stairs and use them. Fix handrails that are broken or loose. Make sure that handrails are as long as the stairways.  Check any carpeting to make sure that it is firmly attached to the stairs. Fix any carpet that is loose or worn.  Avoid having throw rugs at the top or bottom of the stairs. If you do have throw rugs, attach them to the floor with carpet tape.  Make sure that you have a light switch at the top of the stairs and the bottom of the stairs. If you do not have them, ask someone to add them for you. What else can I do to help prevent falls?  Wear shoes that:  Do not have high heels.  Have rubber bottoms.  Are comfortable and fit you well.  Are closed at the toe. Do not wear sandals.  If you  use a stepladder:  Make sure that it is fully opened. Do not climb a closed stepladder.  Make sure that both sides of the stepladder are locked into place.  Ask someone to hold it for you, if possible.  Clearly mark and make sure that you can see:  Any grab bars or handrails.  First and last steps.  Where the edge of each step is.  Use tools that help you move around (mobility aids) if they are needed. These include:  Canes.  Walkers.  Scooters.  Crutches.  Turn on the lights when you go into a dark area. Replace any light bulbs as soon as they burn out.  Set up your furniture so you have a clear path. Avoid moving your furniture around.  If any of your floors are uneven, fix them.  If there are any pets around you, be aware of where they are.  Review your medicines with your doctor. Some medicines can make you feel dizzy. This can increase your chance of falling. Ask your doctor what other things that you can do to help prevent falls. This information is not intended to replace advice given to you by your health care provider. Make sure you discuss any questions you have with your health care provider. Document Released: 10/02/2009 Document Revised: 05/13/2016 Document Reviewed: 01/10/2015 Elsevier Interactive Patient Education  2017 [employer].

## 2020-01-07 NOTE — Patient Instructions (Signed)
The CDC recommends two doses of Shingrix (the shingles vaccine) separated by 2 to 6 months for adults age 78 years and older. I recommend checking with your insurance plan regarding coverage for this vaccine.      Preventive Care 52 Years and Older, Female Preventive care refers to lifestyle choices and visits with your health care provider that can promote health and wellness. This includes:  A yearly physical exam. This is also called an annual well check.  Regular dental and eye exams.  Immunizations.  Screening for certain conditions.  Healthy lifestyle choices, such as diet and exercise. What can I expect for my preventive care visit? Physical exam Your health care provider will check:  Height and weight. These may be used to calculate body mass index (BMI), which is a measurement that tells if you are at a healthy weight.  Heart rate and blood pressure.  Your skin for abnormal spots. Counseling Your health care provider may ask you questions about:  Alcohol, tobacco, and drug use.  Emotional well-being.  Home and relationship well-being.  Sexual activity.  Eating habits.  History of falls.  Memory and ability to understand (cognition).  Work and work Statistician.  Pregnancy and menstrual history. What immunizations do I need?  Influenza (flu) vaccine  This is recommended every year. Tetanus, diphtheria, and pertussis (Tdap) vaccine  You may need a Td booster every 10 years. Varicella (chickenpox) vaccine  You may need this vaccine if you have not already been vaccinated. Zoster (shingles) vaccine  You may need this after age 30. Pneumococcal conjugate (PCV13) vaccine  One dose is recommended after age 101. Pneumococcal polysaccharide (PPSV23) vaccine  One dose is recommended after age 47. Measles, mumps, and rubella (MMR) vaccine  You may need at least one dose of MMR if you were born in 1957 or later. You may also need a second  dose. Meningococcal conjugate (MenACWY) vaccine  You may need this if you have certain conditions. Hepatitis A vaccine  You may need this if you have certain conditions or if you travel or work in places where you may be exposed to hepatitis A. Hepatitis B vaccine  You may need this if you have certain conditions or if you travel or work in places where you may be exposed to hepatitis B. Haemophilus influenzae type b (Hib) vaccine  You may need this if you have certain conditions. You may receive vaccines as individual doses or as more than one vaccine together in one shot (combination vaccines). Talk with your health care provider about the risks and benefits of combination vaccines. What tests do I need? Blood tests  Lipid and cholesterol levels. These may be checked every 5 years, or more frequently depending on your overall health.  Hepatitis C test.  Hepatitis B test. Screening  Lung cancer screening. You may have this screening every year starting at age 63 if you have a 30-pack-year history of smoking and currently smoke or have quit within the past 15 years.  Colorectal cancer screening. All adults should have this screening starting at age 40 and continuing until age 4. Your health care provider may recommend screening at age 67 if you are at increased risk. You will have tests every 1-10 years, depending on your results and the type of screening test.  Diabetes screening. This is done by checking your blood sugar (glucose) after you have not eaten for a while (fasting). You may have this done every 1-3 years.  Mammogram. This  may be done every 1-2 years. Talk with your health care provider about how often you should have regular mammograms.  BRCA-related cancer screening. This may be done if you have a family history of breast, ovarian, tubal, or peritoneal cancers. Other tests  Sexually transmitted disease (STD) testing.  Bone density scan. This is done to screen for  osteoporosis. You may have this done starting at age 86. Follow these instructions at home: Eating and drinking  Eat a diet that includes fresh fruits and vegetables, whole grains, lean protein, and low-fat dairy products. Limit your intake of foods with high amounts of sugar, saturated fats, and salt.  Take vitamin and mineral supplements as recommended by your health care provider.  Do not drink alcohol if your health care provider tells you not to drink.  If you drink alcohol: ? Limit how much you have to 0-1 drink a day. ? Be aware of how much alcohol is in your drink. In the [place] one drink equals one 12 oz bottle of beer (355 mL), one 5 oz glass of wine (148 mL), or one 1 oz glass of hard liquor (44 mL). Lifestyle  Take daily care of your teeth and gums.  Stay active. Exercise for at least 30 minutes on 5 or more days each week.  Do not use any products that contain nicotine or tobacco, such as cigarettes, e-cigarettes, and chewing tobacco. If you need help quitting, ask your health care provider.  If you are sexually active, practice safe sex. Use a condom or other form of protection in order to prevent STIs (sexually transmitted infections).  Talk with your health care provider about taking a low-dose aspirin or statin. What's next?  Go to your health care provider once a year for a well check visit.  Ask your health care provider how often you should have your eyes and teeth checked.  Stay up to date on all vaccines. This information is not intended to replace advice given to you by your health care provider. Make sure you discuss any questions you have with your health care provider. Document Revised: 11/30/2018 Document Reviewed: 11/30/2018 Elsevier Patient Education  2020 [employer].

## 2020-01-07 NOTE — Progress Notes (Signed)
Patient: Carol Kelly, Female    DOB: 04-Apr-1941, 79 y.o.   MRN: [id number] Visit Date: 01/07/2020  Today's Provider: Lavon Paganini, MD   Chief Complaint  Patient presents with  . Annual Exam   Subjective:     Awe with Mckenzie prior to visit   Complete Physical Carol Kelly is a 79 y.o. female. She feels well. She reports exercising occasionaly. She reports she is sleeping fairly well. ---------------------------------------------------------- Pt would like to have a mammogram.  Has a history of benign breast cyst biopsy. She wonders if she is feeling a lump in R breast. Last mammogram in 2017.   Last colonoscopy- 02/19/2011 Cologuard- 10/12/2016  Patient had hysterectomy    Review of Systems  Constitutional: Negative.   HENT: Negative.   Eyes: Negative.   Respiratory: Negative.   Cardiovascular: Negative.   Gastrointestinal: Negative.   Endocrine: Negative.   Genitourinary: Negative.   Musculoskeletal: Negative.   Skin: Negative.   Allergic/Immunologic: Negative.   Neurological: Negative.   Hematological: Negative.   Psychiatric/Behavioral: Negative.     Social History   Socioeconomic History  . Marital status: Divorced    Spouse name: Not on file  . Number of children: 2  . Years of education: 16  . Highest education level: Bachelor's degree (e.g., BA, AB, BS)  Occupational History  . Not on file  Tobacco Use  . Smoking status: Former Smoker    Types: Cigarettes  . Smokeless tobacco: Never Used  . Tobacco comment: Smoked 10 years- not heavy, quit when pregnant with daughter  Substance and Sexual Activity  . Alcohol use: No    Alcohol/week: 0.0 standard drinks  . Drug use: No  . Sexual activity: Yes  Other Topics Concern  . Not on file  Social History Narrative   Currently in a relationship.   Social Determinants of Health   Financial Resource Strain: Low Risk   . Difficulty of Paying Living Expenses: Not hard at all  Food  Insecurity: No Food Insecurity  . Worried About Charity fundraiser in the Last Year: Never true  . Ran Out of Food in the Last Year: Never true  Transportation Needs: No Transportation Needs  . Lack of Transportation (Medical): No  . Lack of Transportation (Non-Medical): No  Physical Activity: Inactive  . Days of Exercise per Week: 0 days  . Minutes of Exercise per Session: 0 min  Stress: No Stress Concern Present  . Feeling of Stress : Not at all  Social Connections: Slightly Isolated  . Frequency of Communication with Friends and Family: More than three times a week  . Frequency of Social Gatherings with Friends and Family: More than three times a week  . Attends Religious Services: More than 4 times per year  . Active Member of Clubs or Organizations: Yes  . Attends Archivist Meetings: More than 4 times per year  . Marital Status: Divorced  Human resources officer Violence: Not At Risk  . Fear of Current or Ex-Partner: No  . Emotionally Abused: No  . Physically Abused: No  . Sexually Abused: No    Past Medical History:  Diagnosis Date  . Allergy   . Cataract      Patient Active Problem List   Diagnosis Date Noted  . Allergic rhinitis 09/26/2015  . Absolute anemia 09/26/2015  . Body mass index (BMI) of 25.0-25.9 in adult 09/26/2015  . DD (diverticular disease) 09/26/2015  . Hot flash, menopausal 09/26/2015  .  Hypercholesteremia 09/26/2015  . Osteopenia 09/26/2015  . Acne erythematosa 09/26/2015  . Avitaminosis D 09/26/2015  . Post-menopausal 08/19/2015    Past Surgical History:  Procedure Laterality Date  . ABDOMINAL HYSTERECTOMY    . APPENDECTOMY    . BREAST BIOPSY Right 11/04/2014   neg  . BREAST CYST ASPIRATION Right 11/04/2014   benign  . DILATION AND CURETTAGE OF UTERUS    . EYE SURGERY Bilateral  left9/24/14   Cataract removed Right 07/18/2013  . OVARIAN CYST REMOVAL  1969  . SIGMOIDOSCOPY  1985    Her family history includes AAA (abdominal  aortic aneurysm) in her father; Brain cancer in her brother; Colon polyps in her father; Dementia in her father; Diabetes in her mother; Heart disease in her brother; Transient ischemic attack in her father.   Current Outpatient Medications:  .  acetaminophen (TYLENOL) 500 MG tablet, Take 500 mg by mouth every 6 (six) hours as needed., Disp: , Rfl:  .  Calcium Carb-Cholecalciferol (CALTRATE 600+D3 SOFT) 600-800 MG-UNIT CHEW, Chew by mouth 2 (two) times daily., Disp: , Rfl:  .  cetirizine (ZYRTEC) 10 MG tablet, Take 10 mg by mouth daily., Disp: , Rfl:  .  cetirizine-pseudoephedrine (ZYRTEC-D) 5-120 MG tablet, Take 1 tablet by mouth., Disp: , Rfl:  .  cycloSPORINE (RESTASIS) 0.05 % ophthalmic emulsion, Place 1 drop into both eyes 2 (two) times daily., Disp: , Rfl:  .  estradiol (ESTRACE) 0.5 MG tablet, Take 1 tablet (0.5 mg total) by mouth daily., Disp: 90 tablet, Rfl: 3 .  fluticasone (FLONASE) 50 MCG/ACT nasal spray, Place 2 sprays into both nostrils. , Disp: , Rfl:  .  Multiple Vitamin (MULTIVITAMIN) capsule, Take 1 capsule by mouth daily., Disp: , Rfl:  .  Multiple Vitamins-Minerals (PRESERVISION AREDS 2 PO), Take by mouth 2 (two) times daily. , Disp: , Rfl:   Patient Care Team: Erasmo Downer, MD as PCP - General (Family Medicine) Lockie Mola, MD as Referring Physician (Ophthalmology) Linus Salmons, MD as Consulting Physician (Otolaryngology)     Objective:    Vitals: There were no vitals taken for this visit.  Physical Exam Vitals reviewed.  Constitutional:      General: She is not in acute distress.    Appearance: Normal appearance. She is well-developed. She is not diaphoretic.  HENT:     Head: Normocephalic and atraumatic.     Right Ear: Tympanic membrane, ear canal and external ear normal.     Left Ear: Tympanic membrane, ear canal and external ear normal.  Eyes:     General: No scleral icterus.    Conjunctiva/sclera: Conjunctivae normal.     Pupils:  Pupils are equal, round, and reactive to light.  Neck:     Thyroid: No thyromegaly.  Cardiovascular:     Rate and Rhythm: Normal rate and regular rhythm.     Pulses: Normal pulses.     Heart sounds: Normal heart sounds. No murmur.  Pulmonary:     Effort: Pulmonary effort is normal. No respiratory distress.     Breath sounds: Normal breath sounds. No wheezing or rales.  Abdominal:     General: There is no distension.     Palpations: Abdomen is soft.     Tenderness: There is no abdominal tenderness.  Genitourinary:    Comments: Breasts: breasts appear normal, no suspicious masses, no skin or nipple changes or axillary nodes.  Musculoskeletal:        General: No deformity.     Cervical back: Neck  supple.     Right lower leg: No edema.     Left lower leg: No edema.  Lymphadenopathy:     Cervical: No cervical adenopathy.  Skin:    General: Skin is warm and dry.     Capillary Refill: Capillary refill takes less than 2 seconds.     Findings: No rash.  Neurological:     Mental Status: She is alert and oriented to person, place, and time. Mental status is at baseline.  Psychiatric:        Mood and Affect: Mood normal.        Behavior: Behavior normal.        Thought Content: Thought content normal.     Activities of Daily Living In your present state of health, do you have any difficulty performing the following activities: 01/07/2020  Hearing? N  Vision? N  Difficulty concentrating or making decisions? N  Walking or climbing stairs? N  Dressing or bathing? N  Doing errands, shopping? N  Preparing Food and eating ? N  Using the Toilet? N  In the past six months, have you accidently leaked urine? Y  Comment Occassionally when seating for a while.  Do you have problems with loss of bowel control? N  Managing your Medications? N  Managing your Finances? N  Housekeeping or managing your Housekeeping? N  Some recent data might be hidden    Fall Risk Assessment Fall Risk   01/07/2020 01/03/2019 01/02/2018 10/01/2016 09/30/2015  Falls in the past year? 0 0 No No No  Number falls in past yr: 0 - - - -  Injury with Fall? 0 - - - -     Depression Screen PHQ 2/9 Scores 01/07/2020 01/07/2020 01/03/2019 01/02/2018  PHQ - 2 Score 0 0 0 0  PHQ- 9 Score - - - 1    6CIT Screen 10/01/2016  What Year? 0 points  What month? 0 points  What time? 0 points  Count back from 20 0 points  Months in reverse 0 points  Repeat phrase 0 points  Total Score 0       Assessment & Plan:    Annual Physical Reviewed patient's Family Medical History Reviewed and updated list of patient's medical providers Assessment of cognitive impairment was done Assessed patient's functional ability Established a written schedule for health screening services Health Risk Assessent Completed and Reviewed  Exercise Activities and Dietary recommendations Goals    . Exercise 150 minutes per week (moderate activity)     Pt to start back walking 5 days a week for 30 minutes at a time.     . Reduce portion size     Starting 10/01/16, I will cut back on my portion sizes and fatty foods.       Immunization History  Administered Date(s) Administered  . Influenza,inj,Quad PF,6+ Mos 08/30/2014  . Influenza-Unspecified 09/15/2016, 09/23/2017, 10/13/2018  . Pneumococcal Conjugate-13 08/30/2014  . Pneumococcal Polysaccharide-23 06/27/2013, 06/27/2014  . Tdap 04/04/2012  . Zoster 10/27/2011    Health Maintenance  Topic Date Due  . INFLUENZA VACCINE  07/21/2019  . DEXA SCAN  10/19/2020  . TETANUS/TDAP  04/04/2022  . PNA vac Low Risk Adult  Completed     Discussed health benefits of physical activity, and encouraged her to engage in regular exercise appropriate for her age and condition.    ------------------------------------------------------------------------------------------------------------ Problem List Items Addressed This Visit      Musculoskeletal and Integument   Osteopenia     Relevant Orders  VITAMIN D 25 Hydroxy (Vit-D Deficiency, Fractures)     Other   Overweight   Relevant Orders   Comprehensive metabolic panel   Lipid panel   CBC   Hemoglobin A1c   VITAMIN D 25 Hydroxy (Vit-D Deficiency, Fractures)   Hypercholesteremia   Relevant Orders   Comprehensive metabolic panel   Lipid panel   Avitaminosis D   Relevant Orders   VITAMIN D 25 Hydroxy (Vit-D Deficiency, Fractures)   History of anemia   Relevant Orders   CBC    Other Visit Diagnoses    Encounter for annual physical exam    -  Primary   Relevant Orders   Comprehensive metabolic panel   Lipid panel   CBC   Hemoglobin A1c   VITAMIN D 25 Hydroxy (Vit-D Deficiency, Fractures)   Hyperglycemia       Relevant Orders   Hemoglobin A1c   Encounter for screening mammogram for malignant neoplasm of breast       Relevant Orders   MM 3D SCREEN BREAST BILATERAL       Return in about 1 year (around 01/06/2021) for CPE/AWV.   The entirety of the information documented in the History of Present Illness, Review of Systems and Physical Exam were personally obtained by me. Portions of this information were initially documented by Abigail Miyamoto, CMA and reviewed by me for thoroughness and accuracy.    Leora Platt, Marzella Schlein, MD MPH [facility]

## 2020-01-08 DIAGNOSIS — E7800 Pure hypercholesterolemia, unspecified: Secondary | ICD-10-CM | POA: Diagnosis not present

## 2020-01-08 DIAGNOSIS — E559 Vitamin D deficiency, unspecified: Secondary | ICD-10-CM | POA: Diagnosis not present

## 2020-01-08 DIAGNOSIS — E663 Overweight: Secondary | ICD-10-CM | POA: Diagnosis not present

## 2020-01-08 DIAGNOSIS — M8589 Other specified disorders of bone density and structure, multiple sites: Secondary | ICD-10-CM | POA: Diagnosis not present

## 2020-01-08 DIAGNOSIS — R739 Hyperglycemia, unspecified: Secondary | ICD-10-CM | POA: Diagnosis not present

## 2020-01-08 DIAGNOSIS — Z862 Personal history of diseases of the blood and blood-forming organs and certain disorders involving the immune mechanism: Secondary | ICD-10-CM | POA: Diagnosis not present

## 2020-01-08 DIAGNOSIS — Z0000 Encounter for general adult medical examination without abnormal findings: Secondary | ICD-10-CM | POA: Diagnosis not present

## 2020-01-09 ENCOUNTER — Other Ambulatory Visit: Payer: Self-pay

## 2020-01-09 LAB — CBC
Hematocrit: 35.4 % (ref 34.0–46.6)
Hemoglobin: 11.7 g/dL (ref 11.1–15.9)
MCH: 31.3 pg (ref 26.6–33.0)
MCHC: 33.1 g/dL (ref 31.5–35.7)
MCV: 95 fL (ref 79–97)
Platelets: 284 10*3/uL (ref 150–450)
RBC: 3.74 x10E6/uL — ABNORMAL LOW (ref 3.77–5.28)
RDW: 11.8 % (ref 11.7–15.4)
WBC: 7.2 10*3/uL (ref 3.4–10.8)

## 2020-01-09 LAB — COMPREHENSIVE METABOLIC PANEL
ALT: 9 IU/L (ref 0–32)
AST: 18 IU/L (ref 0–40)
Albumin/Globulin Ratio: 1.7 (ref 1.2–2.2)
Albumin: 4.3 g/dL (ref 3.7–4.7)
Alkaline Phosphatase: 87 IU/L (ref 39–117)
BUN/Creatinine Ratio: 15 (ref 12–28)
BUN: 12 mg/dL (ref 8–27)
Bilirubin Total: 0.4 mg/dL (ref 0.0–1.2)
CO2: 25 mmol/L (ref 20–29)
Calcium: 9.6 mg/dL (ref 8.7–10.3)
Chloride: 102 mmol/L (ref 96–106)
Creatinine, Ser: 0.81 mg/dL (ref 0.57–1.00)
GFR calc Af Amer: 81 mL/min/{1.73_m2} (ref >59)
GFR calc non Af Amer: 70 mL/min/{1.73_m2} (ref >59)
Globulin, Total: 2.5 g/dL (ref 1.5–4.5)
Glucose: 104 mg/dL — ABNORMAL HIGH (ref 65–99)
Potassium: 4.1 mmol/L (ref 3.5–5.2)
Sodium: 140 mmol/L (ref 134–144)
Total Protein: 6.8 g/dL (ref 6.0–8.5)

## 2020-01-09 LAB — HEMOGLOBIN A1C
Est. average glucose Bld gHb Est-mCnc: 111 mg/dL
Hgb A1c MFr Bld: 5.5 % (ref 4.8–5.6)

## 2020-01-09 LAB — LIPID PANEL
Chol/HDL Ratio: 4.3 ratio (ref 0.0–4.4)
Cholesterol, Total: 255 mg/dL — ABNORMAL HIGH (ref 100–199)
HDL: 59 mg/dL (ref >39)
LDL Chol Calc (NIH): 160 mg/dL — ABNORMAL HIGH (ref 0–99)
Triglycerides: 198 mg/dL — ABNORMAL HIGH (ref 0–149)
VLDL Cholesterol Cal: 36 mg/dL (ref 5–40)

## 2020-01-09 LAB — VITAMIN D 25 HYDROXY (VIT D DEFICIENCY, FRACTURES): Vit D, 25-Hydroxy: 41.4 ng/mL (ref 30.0–100.0)

## 2020-01-09 MED ORDER — ATORVASTATIN CALCIUM 10 MG PO TABS: 10.0000 mg | ORAL_TABLET | Freq: Every day | ORAL | 3 refills | Status: DC

## 2020-01-09 NOTE — Telephone Encounter (Signed)
-----   Message from Erasmo Downer, MD sent at 01/09/2020  2:27 PM EST ----- Normal labs, except Cholesterol is high.  10 year risk of heart disease/stroke is high at 17.7%. A statin medication to lower cholesterol would lower her heart disease/stroke risk significantly.  I know that she has been hesitant about statins previously.  Also recommend diet low in saturated fat and regular exercise - 30 min at least 5 times per week.

## 2020-01-09 NOTE — Telephone Encounter (Signed)
Patient advised as below. Patient verbalizes understanding and is in agreement with treatment plan. Patient requesting to send medication to [facility], and have them deliver medication to patient if possible today.

## 2020-01-15 ENCOUNTER — Ambulatory Visit
Admission: RE | Admit: 2020-01-15 | Discharge: 2020-01-15 | Disposition: A | Discharge status: Home / Self Care | Payer: Medicare PPO | Source: Ambulatory Visit | Attending: Family Medicine | Admitting: Family Medicine

## 2020-01-15 DIAGNOSIS — Z1231 Encounter for screening mammogram for malignant neoplasm of breast: Secondary | ICD-10-CM | POA: Diagnosis not present

## 2020-01-16 ENCOUNTER — Telehealth: Payer: Self-pay

## 2020-01-16 NOTE — Telephone Encounter (Signed)
Comment seen by patient DAMIEN CISAR on 01/16/2020 9:37 AM EST

## 2020-01-16 NOTE — Telephone Encounter (Signed)
-----   Message from Erasmo Downer, MD sent at 01/16/2020  9:35 AM EST ----- Normal mammogram. Repeat in 1 yr

## 2020-02-27 ENCOUNTER — Other Ambulatory Visit: Payer: Self-pay | Admitting: Family Medicine

## 2020-02-27 DIAGNOSIS — Z780 Asymptomatic menopausal state: Secondary | ICD-10-CM

## 2020-02-27 NOTE — Telephone Encounter (Signed)
Requested Prescriptions  Pending Prescriptions Disp Refills  . estradiol (ESTRACE) 0.5 MG tablet [Pharmacy Med Name: ESTRADIOL 0.5 MG TAB] 90 tablet 3    Sig: TAKE ONE TABLET EVERY DAY     OB/GYN:  Estrogens Failed - 02/27/2020  1:49 PM      Failed - Mammogram is up-to-date per Health Maintenance      Passed - Last BP in normal range    BP Readings from Last 1 Encounters:  01/07/20 136/70         Passed - Valid encounter within last 12 months    Recent Outpatient Visits          1 month ago Encounter for annual physical exam   [facility] [place], Marzella Schlein, MD   1 year ago Encounter for annual physical exam   [facility] [place], Marzella Schlein, MD   2 years ago Encounter for annual physical exam   [facility] [place], Marzella Schlein, MD   3 years ago Medicare annual wellness visit, subsequent   [facility] [place], Alessandra [place], [place]   3 years ago Annual physical exam   [facility] [place], [place], [place]

## 2020-04-11 ENCOUNTER — Telehealth: Payer: Self-pay

## 2020-04-11 NOTE — Telephone Encounter (Signed)
Yes. In moderation.

## 2020-04-11 NOTE — Telephone Encounter (Signed)
Copied from CRM [id number]. Topic: General - Call Back - No Documentation >> Apr 11, 2020 11:32 AM Randol Kern wrote: Reason for CRM: Pt needs to know if she can have grapefruit with the current cholesterol medication that she is taking [id number]

## 2020-04-14 NOTE — Telephone Encounter (Signed)
Patient advised.

## 2020-06-19 IMAGING — MG DIGITAL SCREENING BILAT W/ TOMO W/ CAD
8 series · 8 of 24 positions shown · non-contrast
Comparison: Previous exam(s).

CLINICAL DATA: Screening.

EXAM:
DIGITAL SCREENING BILATERAL MAMMOGRAM WITH TOMO AND CAD

[L CC synth-2D]
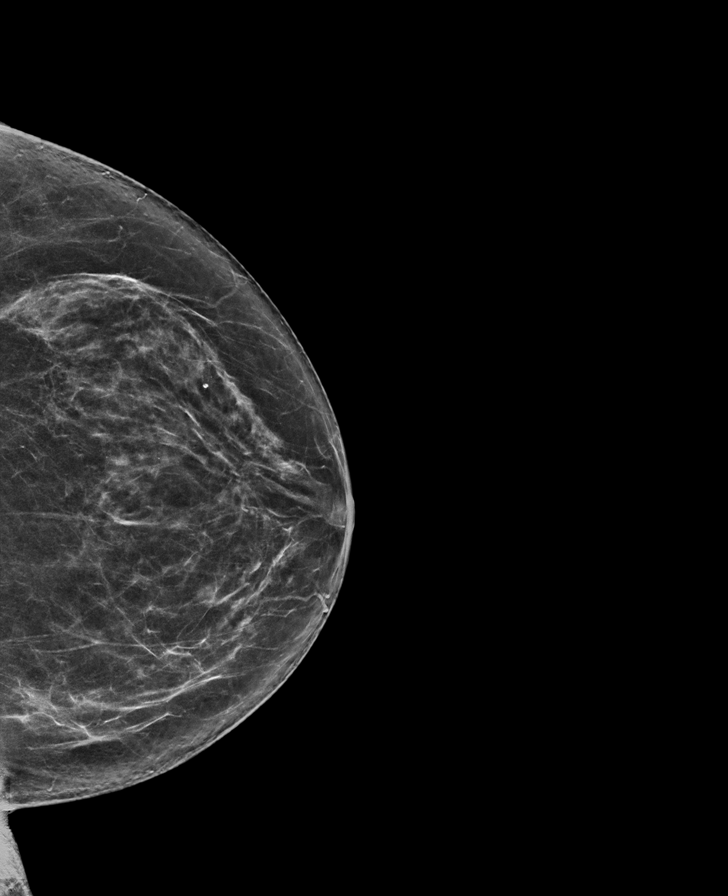

[R CC synth-2D]
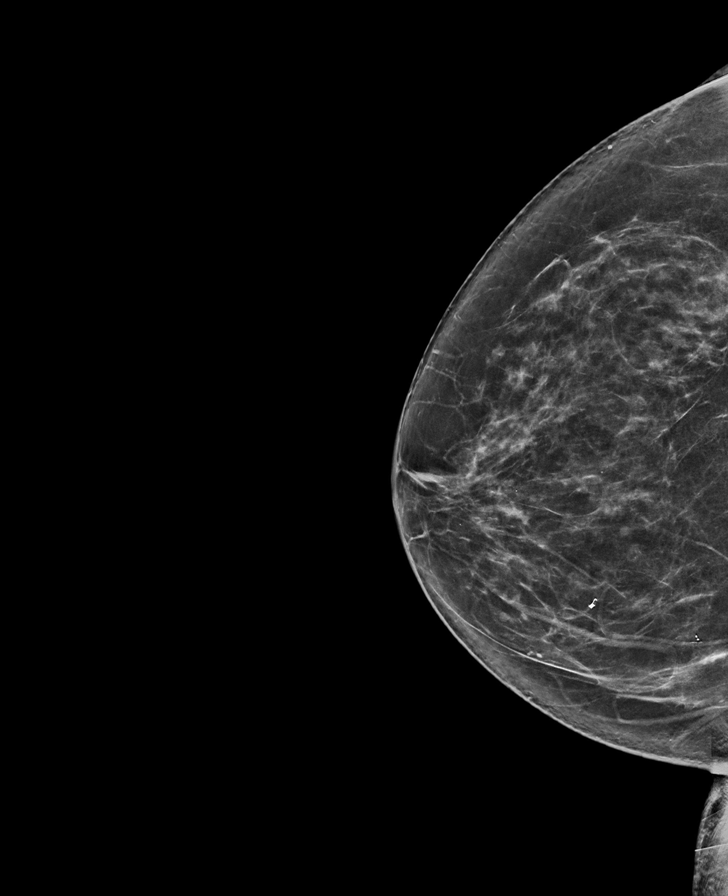

[R MLO synth-2D]
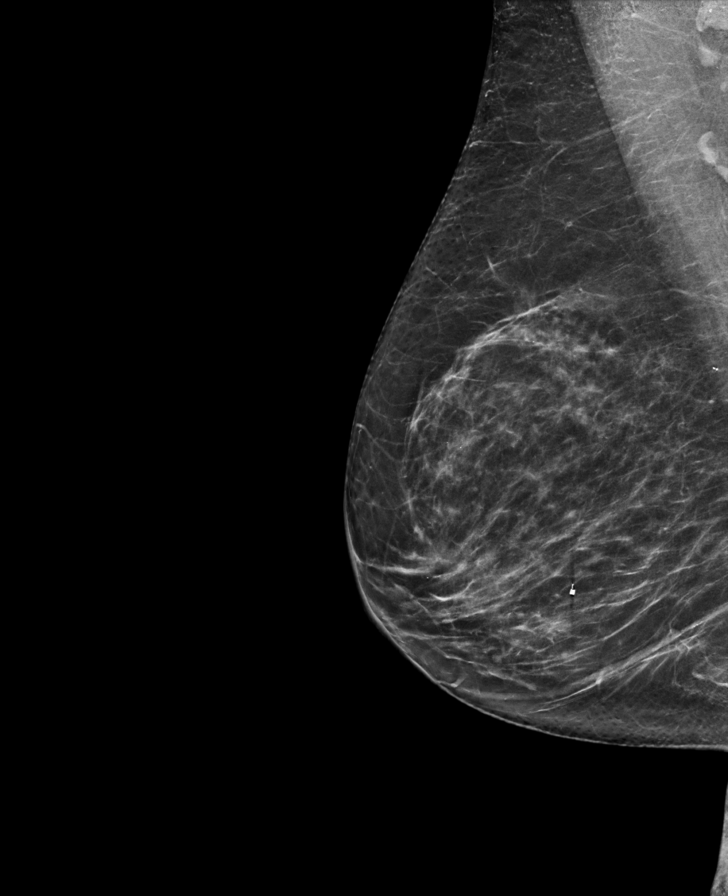

[L MLO synth-2D]
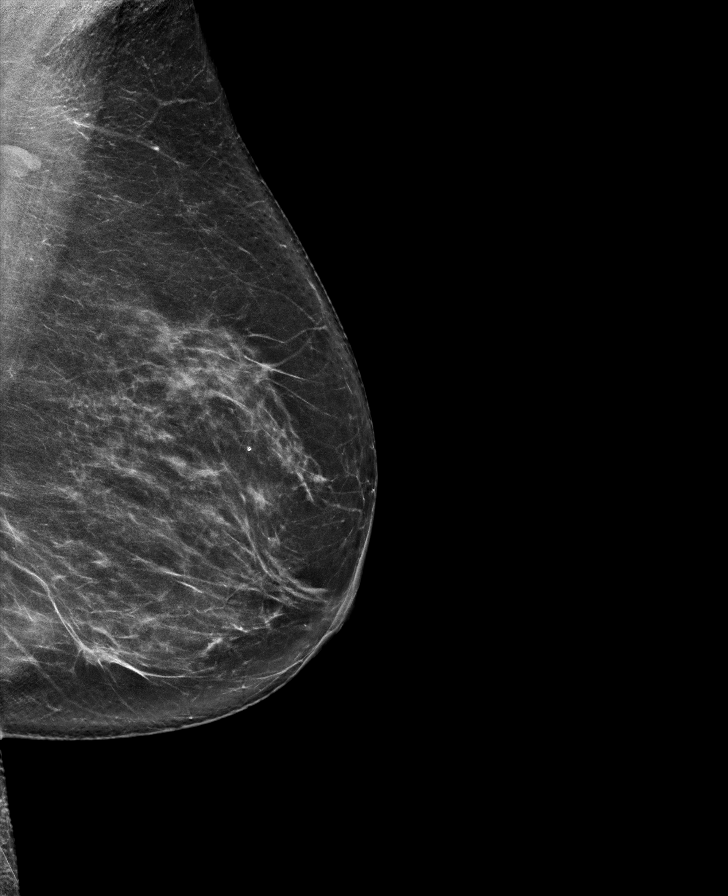

[L MLO tomo · tomo slice 41/82.0]
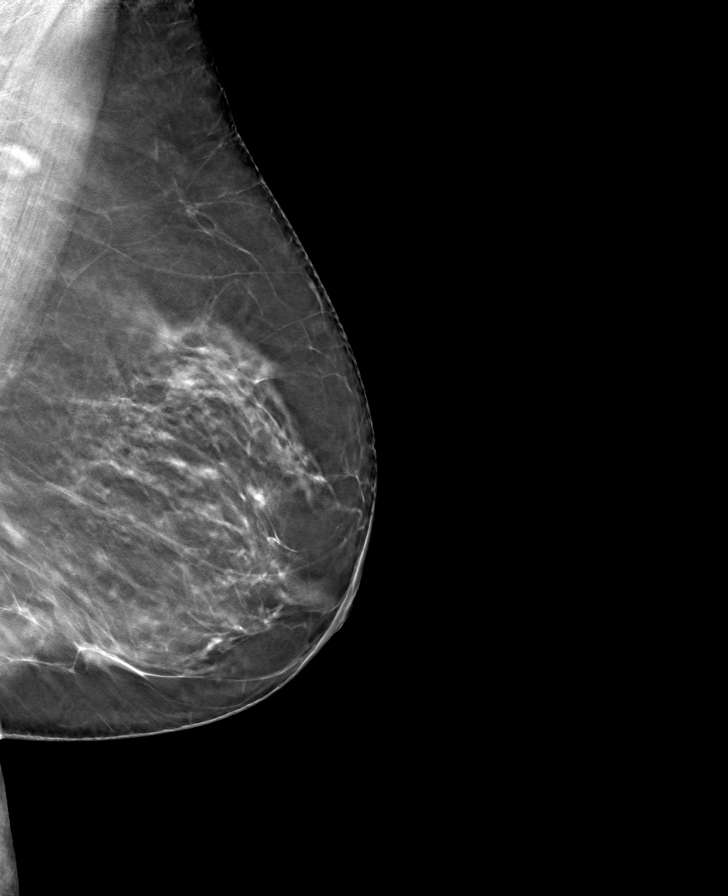

[R CC tomo · tomo slice 39/77.0]
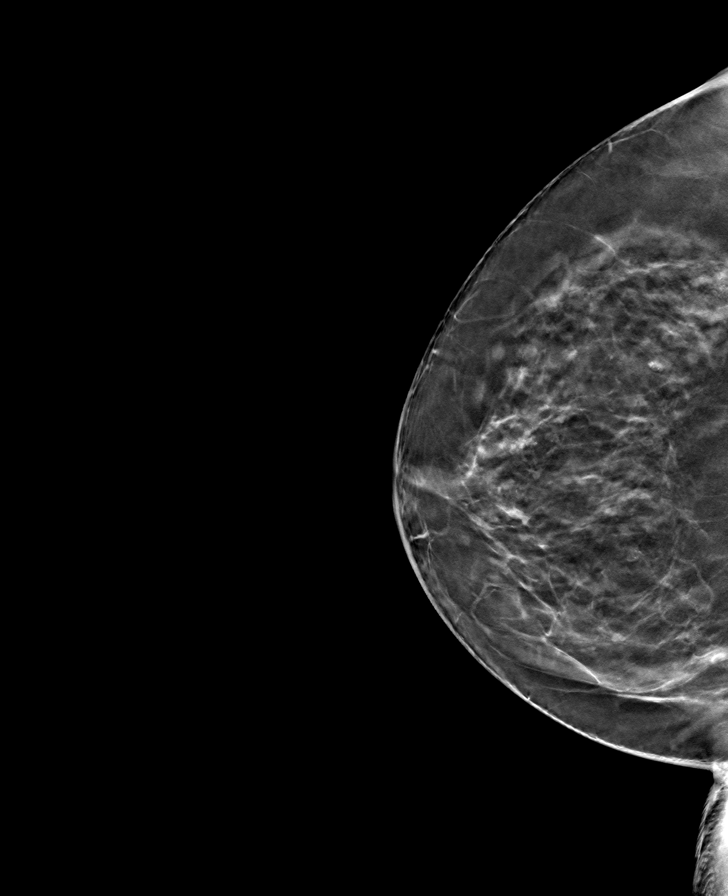

[R MLO tomo · tomo slice 39/76.0]
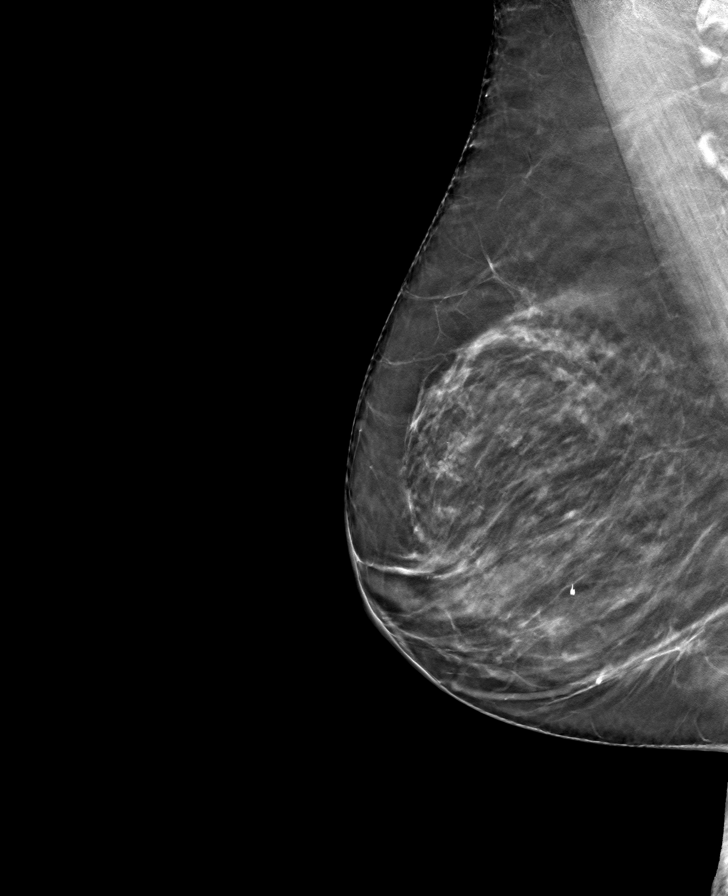

[L CC tomo · tomo slice 39/76.0]
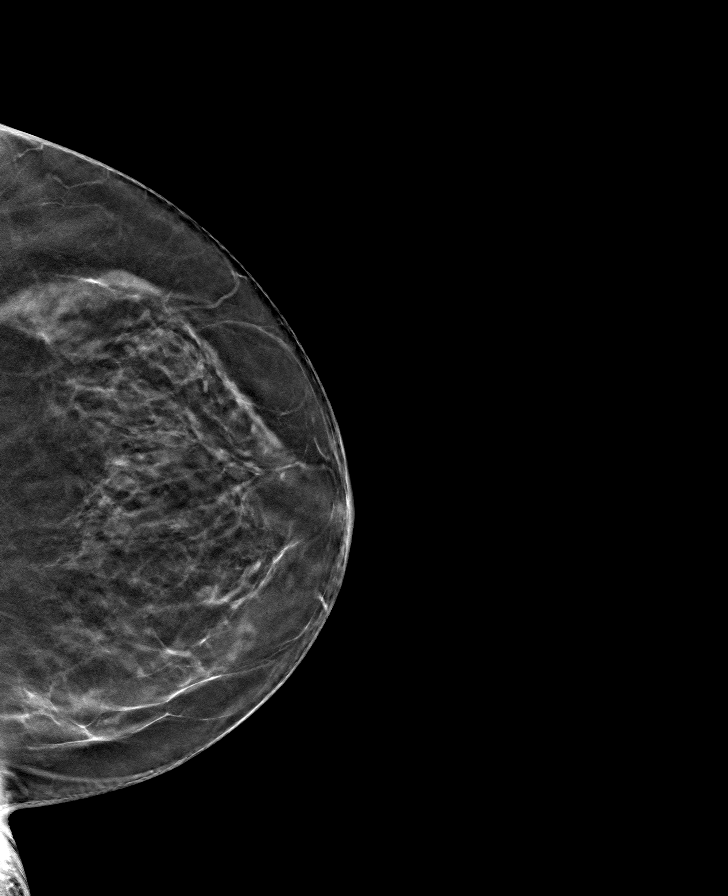

[8 of 24 positions shown; findings below may reference images not displayed]

ACR Breast Density Category c: The breast tissue is heterogeneously
dense, which may obscure small masses.
FINDINGS: There are no findings suspicious for malignancy. Images were
processed with CAD.
IMPRESSION: No mammographic evidence of malignancy. A result letter of this
screening mammogram will be mailed directly to the patient.

RECOMMENDATION:
Screening mammogram in one year. (Code:[id number])

BI-RADS CATEGORY  1: Negative.

## 2020-07-07 DIAGNOSIS — H353132 Nonexudative age-related macular degeneration, bilateral, intermediate dry stage: Secondary | ICD-10-CM | POA: Diagnosis not present

## 2020-09-24 ENCOUNTER — Ambulatory Visit: Payer: Self-pay | Admitting: *Deleted

## 2020-09-24 ENCOUNTER — Ambulatory Visit (INDEPENDENT_AMBULATORY_CARE_PROVIDER_SITE_OTHER): Payer: Medicare PPO | Admitting: Adult Health

## 2020-09-24 ENCOUNTER — Other Ambulatory Visit: Payer: Self-pay

## 2020-09-24 ENCOUNTER — Encounter: Payer: Self-pay | Admitting: Adult Health

## 2020-09-24 VITALS — BP 133/75 | HR 85 | Temp 97.8°F | Resp 16 | Wt 156.4 lb

## 2020-09-24 DIAGNOSIS — L258 Unspecified contact dermatitis due to other agents: Secondary | ICD-10-CM

## 2020-09-24 DIAGNOSIS — R21 Rash and other nonspecific skin eruption: Secondary | ICD-10-CM | POA: Insufficient documentation

## 2020-09-24 DIAGNOSIS — L259 Unspecified contact dermatitis, unspecified cause: Secondary | ICD-10-CM | POA: Insufficient documentation

## 2020-09-24 MED ORDER — BETAMETHASONE DIPROPIONATE 0.05 % EX CREA: TOPICAL_CREAM | Freq: Two times a day (BID) | CUTANEOUS | 0 refills | Status: DC

## 2020-09-24 MED ORDER — PREDNISONE 10 MG (21) PO TBPK: ORAL_TABLET | ORAL | 0 refills | Status: DC

## 2020-09-24 NOTE — Progress Notes (Signed)
Established patient visit   Patient: Carol Kelly   DOB: 04/17/1941   79 y.o. Female  MRN: [id number] Visit Date: 09/24/2020  Today's healthcare provider: Jairo Ben, FNP   Chief Complaint  Patient presents with   Rash   Subjective    Rash This is a new problem. The current episode started in the past 7 days. The problem has been gradually worsening since onset. The affected locations include the right wrist, right arm and left arm. The rash is characterized by draining, itchiness, blistering, pain and redness. She was exposed to plant contact. Pertinent negatives include no anorexia, congestion, cough, diarrhea, eye pain, facial edema, fatigue, fever, joint pain, nail changes, rhinorrhea, shortness of breath, sore throat or vomiting. Past treatments include anti-itch cream. The treatment provided moderate relief. Her past medical history is significant for allergies.    She was working in her yard three days in a row. She has used anti itch creams, allegra and gold bond.She also is taking zyrtec. She was laying on the ground with her right forearm in the grass and pine needles, she noticed on  Right arm first and then on left arm. Intense itching of both areas. She does  Not like calamine lotion.   She has a history of getting passion oak. Denies any paresthesias. She reports right arm has improved with over the counter treatments as above. Denies any inhalation.   Patient  denies any fever, body aches,chills,  chest pain, shortness of breath, nausea, vomiting, or diarrhea.   Patient Active Problem List   Diagnosis Date Noted   Contact dermatitis 09/24/2020   Rash and nonspecific skin eruption 09/24/2020   History of anemia 01/07/2020   Allergic rhinitis 09/26/2015   Overweight 09/26/2015   DD (diverticular disease) 09/26/2015   Hot flash, menopausal 09/26/2015   Hypercholesteremia 09/26/2015   Osteopenia 09/26/2015   Acne erythematosa 09/26/2015     Avitaminosis D 09/26/2015   Post-menopausal 08/19/2015   Past Medical History:  Diagnosis Date   Allergy    Cataract    Past Surgical History:  Procedure Laterality Date   ABDOMINAL HYSTERECTOMY     APPENDECTOMY     BREAST BIOPSY Right 11/04/2014   neg   BREAST CYST ASPIRATION Right 11/04/2014   benign   DILATION AND CURETTAGE OF UTERUS     EYE SURGERY Bilateral  left9/24/14   Cataract removed Right 07/18/2013   OVARIAN CYST REMOVAL  1969   SIGMOIDOSCOPY  1985   Social History   Socioeconomic History   Marital status: Divorced    Spouse name: Not on file   Number of children: 2   Years of education: 16   Highest education level: Bachelor's degree (e.g., BA, AB, BS)  Occupational History   Not on file  Tobacco Use   Smoking status: Former Smoker    Types: Cigarettes   Smokeless tobacco: Never Used   Tobacco comment: Smoked 10 years- not heavy, quit when pregnant with daughter  Vaping Use   Vaping Use: Never used  Substance and Sexual Activity   Alcohol use: No    Alcohol/week: 0.0 standard drinks   Drug use: No   Sexual activity: Yes  Other Topics Concern   Not on file  Social History Narrative   Currently in a relationship.   Social Determinants of Health   Financial Resource Strain: Low Risk    Difficulty of Paying Living Expenses: Not hard at all  Food Insecurity: No Food Insecurity  Worried About Programme researcher, broadcasting/film/video in the Last Year: Never true   [employer] of Food in the Last Year: Never true  Transportation Needs: No Transportation Needs   Lack of Transportation (Medical): No   Lack of Transportation (Non-Medical): No  Physical Activity: Inactive   Days of Exercise per Week: 0 days   Minutes of Exercise per Session: 0 min  Stress: No Stress Concern Present   Feeling of Stress : Not at all  Social Connections: Moderately Integrated   Frequency of Communication with Friends and Family: More than three times a week    Frequency of Social Gatherings with Friends and Family: More than three times a week   Attends Religious Services: More than 4 times per year   Active Member of [employer] or Organizations: Yes   Attends Engineer, structural: More than 4 times per year   Marital Status: Divorced  Catering manager Violence: Not At Risk   Fear of Current or Ex-Partner: No   Emotionally Abused: No   Physically Abused: No   Sexually Abused: No       Medications: Outpatient Medications Prior to Visit  Medication Sig   acetaminophen (TYLENOL) 500 MG tablet Take 500 mg by mouth every 6 (six) hours as needed.   atorvastatin (LIPITOR) 10 MG tablet Take 1 tablet (10 mg total) by mouth daily.   Calcium Carb-Cholecalciferol (CALTRATE 600+D3 SOFT) 600-800 MG-UNIT CHEW Chew by mouth 2 (two) times daily.   cetirizine (ZYRTEC) 10 MG tablet Take 10 mg by mouth daily.   cycloSPORINE (RESTASIS) 0.05 % ophthalmic emulsion Place 1 drop into both eyes 2 (two) times daily.   estradiol (ESTRACE) 0.5 MG tablet TAKE ONE TABLET EVERY DAY   Multiple Vitamin (MULTIVITAMIN) capsule Take 1 capsule by mouth daily.   Multiple Vitamins-Minerals (PRESERVISION AREDS 2 PO) Take by mouth 2 (two) times daily.    [DISCONTINUED] cetirizine-pseudoephedrine (ZYRTEC-D) 5-120 MG tablet Take 1 tablet by mouth.   No facility-administered medications prior to visit.    Review of Systems  Constitutional: Negative for fatigue and fever.  HENT: Negative for congestion, rhinorrhea and sore throat.   Eyes: Negative for pain.  Respiratory: Negative for cough and shortness of breath.   Gastrointestinal: Negative for anorexia, diarrhea and vomiting.  Musculoskeletal: Negative for joint pain.  Skin: Positive for rash. Negative for nail changes.    Last CBC Lab Results  Component Value Date   WBC 7.2 01/08/2020   HGB 11.7 01/08/2020   HCT 35.4 01/08/2020   MCV 95 01/08/2020   MCH 31.3 01/08/2020   RDW 11.8 01/08/2020    PLT 284 01/08/2020   Last metabolic panel Lab Results  Component Value Date   GLUCOSE 104 (H) 01/08/2020   NA 140 01/08/2020   K 4.1 01/08/2020   CL 102 01/08/2020   CO2 25 01/08/2020   BUN 12 01/08/2020   CREATININE 0.81 01/08/2020   GFRNONAA 70 01/08/2020   GFRAA 81 01/08/2020   CALCIUM 9.6 01/08/2020   PROT 6.8 01/08/2020   ALBUMIN 4.3 01/08/2020   LABGLOB 2.5 01/08/2020   AGRATIO 1.7 01/08/2020   BILITOT 0.4 01/08/2020   ALKPHOS 87 01/08/2020   AST 18 01/08/2020   ALT 9 01/08/2020   Last lipids Lab Results  Component Value Date   CHOL 255 (H) 01/08/2020   HDL 59 01/08/2020   LDLCALC 160 (H) 01/08/2020   TRIG 198 (H) 01/08/2020   CHOLHDL 4.3 01/08/2020   Last hemoglobin A1c Lab Results  Component Value Date   HGBA1C 5.5 01/08/2020   Last thyroid functions Lab Results  Component Value Date   TSH 4.130 10/12/2016   Last vitamin D Lab Results  Component Value Date   VD25OH 41.4 01/08/2020   Last vitamin B12 and Folate No results found for: VITAMINB12, FOLATE    Objective    BP 133/75    Pulse 85    Temp 97.8 F (36.6 C) (Oral)    Resp 16    Wt 156 lb 6.4 oz (70.9 kg)    SpO2 100%    BMI 26.03 kg/m  BP Readings from Last 3 Encounters:  09/24/20 133/75  01/07/20 136/70  01/03/19 112/80      Physical Exam Constitutional:      General: She is not in acute distress.    Appearance: Normal appearance. She is not ill-appearing, toxic-appearing or diaphoretic.  HENT:     Head: Normocephalic and atraumatic.     Nose: Nose normal.     Mouth/Throat:     Mouth: Mucous membranes are moist.     Pharynx: No oropharyngeal exudate.  Eyes:     General: No scleral icterus.    Conjunctiva/sclera: Conjunctivae normal.     Pupils: Pupils are equal, round, and reactive to light.  Cardiovascular:     Rate and Rhythm: Normal rate and regular rhythm.     Pulses: Normal pulses.     Heart sounds: Normal heart sounds. No murmur heard.  No gallop.   Pulmonary:      Effort: Pulmonary effort is normal. No respiratory distress.     Breath sounds: No stridor. No wheezing, rhonchi or rales.  Abdominal:     Palpations: Abdomen is soft.  Musculoskeletal:        General: Normal range of motion.     Cervical back: Normal range of motion and neck supple.  Skin:    Capillary Refill: Capillary refill takes less than 2 seconds.     Findings: Erythema and rash present. Rash is crusting, macular, papular and vesicular.     Comments: See media  Picture of rash below:  rash on lower right and left anterior forearm.  No other rash noted or reported.  No facial involvement.  No warmth.   Neurological:     General: No focal deficit present.     Mental Status: She is alert and oriented to person, place, and time.  Psychiatric:        Mood and Affect: Mood normal.        Behavior: Behavior normal.        Thought Content: Thought content normal.        Judgment: Judgment normal.     Media Information   Document Information  Photos  Left arm   09/24/2020 14:35  Attached To:  Office Visit on 09/24/20 with Jesika Men, Eula Fried, FNP  Source Information  Matty Vanroekel, Eula Fried, FNP   [facility]  Media Information   Document Information  Photos  Right arm   09/24/2020 14:35  Attached To:  Office Visit on 09/24/20 with Bethene Hankinson, Eula Fried, FNP  Source Information  Juergen Hardenbrook, Eula Fried, FNP   [facility]      No results found for any visits on 09/24/20.  Assessment & Plan    Contact dermatitis due to other agent, unspecified contact dermatitis type  Rash and nonspecific skin eruption  Meds ordered this encounter  Medications   predniSONE (STERAPRED UNI-PAK 21 TAB) 10 MG (21)  TBPK tablet    Sig: PO: Take 6 tablets on day 1:Take 5 tablets day 2:Take 4 tablets day 3: Take 3 tablets day 4:Take 2 tablets day five: 5 Take 1 tablet day 6    Dispense:  21 tablet    Refill:  0   betamethasone dipropionate 0.05 % cream    Sig:  Apply topically 2 (two) times daily for 6 days. Apply a thin layer as directed above.    Dispense:  15 g    Refill:  0   Side effects discussed of medications as above and how to take and apply.   If there are blisters, compress the areas for 15 minutes with cool Ice packs or cold showers will temporarily relieve itching. Steroids are less effective when the skin is blistered. Avoid soap as it irritates.  Prevention The only way to prevent plant dermatitis is to avoid contact with the responsible plant when it has been identified.  Advised her not recommended to take allegra and zyrtec together, if needed for itching do one or the other and if needed could try 12.5mg  to 25mg  of Benadryl per package instructions for itching.  Red Flags discussed. The patient was given clear instructions to go to ER or return to medical center if any red flags develop, symptoms do not improve, worsen or new problems develop. They verbalized understanding.  Advised patient call the office or your primary care doctor for an appointment if no improvement within 72 hours or if any symptoms change or worsen at any time  Advised ER or urgent Care if after hours or on weekend. Call 911 for emergency symptoms at any time.Patinet verbalized understanding of all instructions given/reviewed and treatment plan and has no further questions or concerns at this time.    Addressed acute medical problems requiring 31 minutes reviewing patients medical record,labs, counseling patient regarding patient's conditions, any medications, answering questions regarding health, and coordination of care as needed. After visit summary patient given copy and reviewed.   Return if symptoms worsen or fail to improve, for at any time for any worsening symptoms, Go to Emergency room/ urgent care if worse.        Jairo BenMichelle Smith Keionte Swicegood, FNP  [facility] [phone] (phone) [phone] (fax)  [facility]

## 2020-09-24 NOTE — Telephone Encounter (Signed)
Patient is calling to report she has a rash on forearm- she thinks she can on contact with something outdoors that cause reaction.  Reason for Disposition . [1] Increasing redness around poison ivy AND [2] larger than 2 inches (5 cm)  Answer Assessment - Initial Assessment Questions 1. APPEARANCE of RASH: "Describe the rash."      Weeping, red, swelling, bumpy 2. LOCATION: "Where is the rash located?"      Rash on R arm that was resting on ground- forearm 3. SIZE: "How large is the rash?"      Covers 3 inches of forearm- largest patch 4. ONSET: "When did the rash begin?"      Sunday 5. ITCHING: "Does the rash itch?" If Yes, ask: "How bad is it?"   - MILD - doesn't interfere with normal activities   - MODERATE-SEVERE: interferes with work, school, sleep, or other activities      severe 6. PREGNANCY: "Is there any chance you are pregnant?" "When was your last menstrual period?"     n/a  Protocols used: POISON IVY - [place] - SUMAC-A-AH

## 2020-09-24 NOTE — Patient Instructions (Addendum)
If there are blisters, compress the areas for 15 minutes twice daily with a mixture of a tablespoon of white vinegar in a liter  of water. Ice packs or cold showers will temporarily relieve itching. Steroids are less effective when the skin is blistered. Avoid soap as it irritates.  Prevention The only way to prevent plant dermatitis is to avoid contact with the responsible plant when it has been identified.  Betamethasone skin cream, gel, lotion, or ointment What is this medicine? BETAMETHASONE (bay ta METH a sone) is a corticosteroid. It is used on the skin to treat itching, redness, and swelling caused by some skin conditions. This medicine may be used for other purposes; ask your health care provider or pharmacist if you have questions. COMMON BRAND NAME(S): Alphatrex, Beta Derm, Beta-Val, Betanate, Betatrex, Del-Beta, Diprolene, Diprolene AF, Diprosone, Maxivate, RRB Pak, Valisone What should I tell my health care provider before I take this medicine? They need to know if you have any of these conditions:  acne or rosacea  any type of active infection  diabetes  eye disease, vision problems  glaucoma or cataracts  large areas of burned or damaged skin  skin wasting or thinning  an unusual or allergic reaction to betamethasone, corticosteroids, other medicines, foods, dyes, or preservatives  pregnant or trying to get pregnant  breast-feeding How should I use this medicine? This medicine is for external use only. Do not take by mouth. Follow the directions on the prescription label. Wash your hands before and after use. Apply a thin film of medicine to the affected area. Do not cover with a bandage or dressing unless your doctor or health care professional tells you to. Do not use on healthy skin or over large areas of skin. Do not get this medicine in your eyes. If you do, rinse out with plenty of cool tap water. It is important not to use more medicine than prescribed. Do not  use your medicine more often than directed. Do not use for more than 14 days. Talk to your pediatrician regarding the use of this medicine in children. Special care may be needed. While this drug may be prescribed for children as young as 18 years of age for selected conditions, precautions do apply. If applying this medicine to the diaper area of a child, do not cover with tight-fitting diapers or plastic pants. This may increase the amount of medicine that passes through the skin and increase the risk of serious side effects. Elderly patients are more likely to have damaged skin through aging, and this may increase side effects. This medicine should only be used for brief periods and infrequently in older patients. Overdosage: If you think you have taken too much of this medicine contact a poison control center or emergency room at once. NOTE: This medicine is only for you. Do not share this medicine with others. What if I miss a dose? If you miss a dose, use it as soon as you can. If it is almost time for your next dose, use only that dose. Do not use double or extra doses. What may interact with this medicine? Interactions are not expected. Do not use any other skin products without telling your doctor or health care professional. This list may not describe all possible interactions. Give your health care provider a list of all the medicines, herbs, non-prescription drugs, or dietary supplements you use. Also tell them if you smoke, drink alcohol, or use illegal drugs. Some items may interact with  your medicine. What should I watch for while using this medicine? Tell your doctor or health care professional if your symptoms do not improve within one week. Tell your doctor or health care professional if you are exposed to anyone with measles or chickenpox, or if you develop sores or blisters that do not heal properly. What side effects may I notice from receiving this medicine? Side effects that you  should report to your doctor or health care professional as soon as possible:  allergic reactions like skin rash, itching or hives, swelling of the face, lips, or tongue  burning or itching of the skin that does not go away  dark red spots on the skin  infection  lack of healing of skin condition  painful, red, pus filled blisters in hair follicles  thinning of the skin, with easy bruising Side effects that usually do not require medical attention (report to your doctor or health care professional if they continue or are bothersome):  dry skin  increased redness or scaling of the skin  mild burning, itching, or irritation of the skin This list may not describe all possible side effects. Call your doctor for medical advice about side effects. You may report side effects to FDA at 1-800-FDA-1088. Where should I keep my medicine? Keep out of the reach of children. Store at room temperature between 2 and 30 degrees C (36 and 86 degrees F). Do not freeze. Throw away any unused medicine after the expiration date. NOTE: This sheet is a summary. It may not cover all possible information. If you have questions about this medicine, talk to your doctor, pharmacist, or health care provider.  2020 Elsevier/Gold Standard (2018-04-26 15:28:00) Contact Dermatitis Dermatitis is redness, soreness, and swelling (inflammation) of the skin. Contact dermatitis is a reaction to something that touches the skin. There are two types of contact dermatitis:  Irritant contact dermatitis. This happens when something bothers (irritates) your skin, like soap.  Allergic contact dermatitis. This is caused when you are exposed to something that you are allergic to, such as poison ivy. What are the causes?  Common causes of irritant contact dermatitis include: ? Makeup. ? Soaps. ? Detergents. ? Bleaches. ? Acids. ? Metals, such as nickel.  Common causes of allergic contact dermatitis  include: ? Plants. ? Chemicals. ? Jewelry. ? Latex. ? Medicines. ? Preservatives in products, such as clothing. What increases the risk?  Having a job that exposes you to things that bother your skin.  Having asthma or eczema. What are the signs or symptoms? Symptoms may happen anywhere the irritant has touched your skin. Symptoms include:  Dry or flaky skin.  Redness.  Cracks.  Itching.  Pain or a burning feeling.  Blisters.  Blood or clear fluid draining from skin cracks. With allergic contact dermatitis, swelling may occur. This may happen in places such as the eyelids, mouth, or genitals. How is this treated?  This condition is treated by checking for the cause of the reaction and protecting your skin. Treatment may also include: ? Steroid creams, ointments, or medicines. ? Antibiotic medicines or other ointments, if you have a skin infection. ? Lotion or medicines to help with itching. ? A bandage (dressing). Follow these instructions at home: Skin care  Moisturize your skin as needed.  Put cool cloths on your skin.  Put a baking soda paste on your skin. Stir water into baking soda until it looks like a paste.  Do not scratch your skin.  Avoid having  things rub up against your skin.  Avoid the use of soaps, perfumes, and dyes. Medicines  Take or apply over-the-counter and prescription medicines only as told by your doctor.  If you were prescribed an antibiotic medicine, take or apply it as told by your doctor. Do not stop using it even if your condition starts to get better. Bathing  Take a bath with: ? Epsom salts. ? Baking soda. ? Colloidal oatmeal.  Bathe less often.  Bathe in warm water. Avoid using hot water. Bandage care  If you were given a bandage, change it as told by your health care provider.  Wash your hands with soap and water before and after you change your bandage. If soap and water are not available, use hand  sanitizer. General instructions  Avoid the things that caused your reaction. If you do not know what caused it, keep a journal. Write down: ? What you eat. ? What skin products you use. ? What you drink. ? What you wear in the area that has symptoms. This includes jewelry.  Check the affected areas every day for signs of infection. Check for: ? More redness, swelling, or pain. ? More fluid or blood. ? Warmth. ? Pus or a bad smell.  Keep all follow-up visits as told by your doctor. This is important. Contact a doctor if:  You do not get better with treatment.  Your condition gets worse.  You have signs of infection, such as: ? More swelling. ? Tenderness. ? More redness. ? Soreness. ? Warmth.  You have a fever.  You have new symptoms. Get help right away if:  You have a very bad headache.  You have neck pain.  Your neck is stiff.  You throw up (vomit).  You feel very sleepy.  You see red streaks coming from the area.  Your bone or joint near the area hurts after the skin has healed.  The area turns darker.  You have trouble breathing. Summary  Dermatitis is redness, soreness, and swelling of the skin.  Symptoms may occur where the irritant has touched you.  Treatment may include medicines and skin care.  If you do not know what caused your reaction, keep a journal.  Contact a doctor if your condition gets worse or you have signs of infection. This information is not intended to replace advice given to you by your health care provider. Make sure you discuss any questions you have with your health care provider. Document Revised: 03/28/2019 Document Reviewed: 06/21/2018 Elsevier Patient Education  2020 [employer].  Poison Oak Dermatitis  Poison oak dermatitis is redness and soreness (inflammation) of the skin caused by chemicals in the leaves of the poison oak plant. You may have very bad itching, swelling, a rash, and blisters. What are the  causes? You may get this condition by:  Touching a poison oak plant.  Touching something that has the chemical from the leaves on it. This may include animals or objects that have come in contact with the plant. What increases the risk? You are more likely to get this condition if you:  Go outdoors often in wooded or marshy areas.  Go outdoors without wearing protective clothing, such as closed shoes, long pants, and a long-sleeved shirt. What are the signs or symptoms? Symptoms of this condition include:  Redness of the skin.  Very bad itching.  A rash that often includes bumps and blisters. ? The rash usually appears 48 hours after exposure if you have  been exposed before. ? If this is the first time you have been exposed, the rash may not appear until a week after exposure.  Swelling. This may occur if the reaction is very bad. Symptoms often clear up in 1-2 weeks. The first time you develop this condition, symptoms may last 3-4 weeks. How is this treated? This condition may be treated with:  Hydrocortisone creams or calamine lotions to help with itching.  Oatmeal baths to soothe the skin.  Medicines to help reduce itching (antihistamines). If you have a very bad reaction, you may also be given steroid medicines. Follow these instructions at home: Medicines  Take or apply over-the-counter and prescription medicines only as told by your doctor.  Use hydrocortisone creams or calamine lotion as needed to help with itching. General instructions  Do not scratch or rub your skin.  Put a cold, wet cloth (cold compress) on the affected areas or take baths in cool water. This will help with itching.  Avoid hot baths and showers.  Take oatmeal baths as needed. Use colloidal oatmeal. You can get this at a pharmacy or grocery store. Follow the instructions on the package.  While you have the rash, wash your clothes right after you wear them.  Keep all follow-up visits as  told by your doctor. This is important. How is this prevented?   Know what poison oak looks like so you can avoid it. ? This plant has three leaves with flowering branches on a single stem. ? The leaves are fuzzy. ? The edges of the leaves look like teeth.  If you have touched poison oak, wash your skin with soap and water right away. Be sure to wash under your fingernails.  When hiking or camping, wear long pants, a long-sleeved shirt, tall socks, and hiking boots. You can also use a lotion on your skin that helps to prevent contact with the chemical on the plant.  If you think that your clothes or outdoor gear came in contact with poison oak, rinse them off with a garden hose before you bring them inside your house.  When doing yard work or gardening, wear gloves, long sleeves, long pants, and boots. Wash your garden tools and gloves if they come in contact with poison oak.  If you think that your pet has come into contact with poison oak, wash him or her with pet shampoo and water. Make sure you wear gloves while washing your pet.  Do not burn poison oak plants. This can release the chemical from the plant into the air and may cause a reaction. Contact a doctor if:  You have open sores in the rash area.  You have more redness, swelling, or pain in the affected area.  You have redness that spreads beyond the rash area.  You have fluid, blood, or pus coming from the affected area.  You have a fever.  You have a rash over a large area of your body.  You have a rash on your eyes, mouth, or genitals.  Your rash does not improve after a few weeks. Get help right away if:  Your face swells or your eyes swell shut.  You have trouble breathing.  You have trouble swallowing. These symptoms may be an emergency. Do not wait to see if the symptoms will go away. Get medical help right away. Call your local emergency services (911 in the [place] Do not drive yourself to the  hospital. Summary  Poison oak dermatitis is redness and  soreness of the skin caused by chemicals in the leaves of the poison oak plant.  Symptoms of this condition include redness, very bad itching, a rash, and swelling.  Do not scratch or rub your skin.  Take or apply over-the-counter and prescription medicines only as told by your doctor. This information is not intended to replace advice given to you by your health care provider. Make sure you discuss any questions you have with your health care provider. Document Revised: 03/30/2019 Document Reviewed: 01/05/2019 Elsevier Patient Education  2020 [employer] Prednisolone tablets What is this medicine? PREDNISOLONE (pred NISS oh lone) is a corticosteroid. It is commonly used to treat inflammation of the skin, joints, lungs, and other organs. Common conditions treated include asthma, allergies, and arthritis. It is also used for other conditions, such as blood disorders and diseases of the adrenal glands. This medicine may be used for other purposes; ask your health care provider or pharmacist if you have questions. COMMON BRAND NAME(S): Millipred, Millipred DP, Millipred DP 12-Day, Millipred DP 6 Day, Prednoral What should I tell my health care provider before I take this medicine? They need to know if you have any of these conditions:  Cushing's syndrome  diabetes  glaucoma  heart problems or disease  high blood pressure  infection such as herpes, measles, tuberculosis, or chickenpox  kidney disease  liver disease  mental problems  myasthenia gravis  osteoporosis  seizures  stomach ulcer or intestine disease including colitis and diverticulitis  thyroid problem  an unusual or allergic reaction to lactose, prednisolone, other medicines, foods, dyes, or preservatives  pregnant or trying to get pregnant  breast-feeding How should I use this medicine? Take this medicine by mouth with a glass of water. Follow  the directions on the prescription label. Take it with food or milk to avoid stomach upset. If you are taking this medicine once a day, take it in the morning. Do not take more medicine than you are told to take. Do not suddenly stop taking your medicine because you may develop a severe reaction. Your doctor will tell you how much medicine to take. If your doctor wants you to stop the medicine, the dose may be slowly lowered over time to avoid any side effects. Talk to your pediatrician regarding the use of this medicine in children. Special care may be needed. Overdosage: If you think you have taken too much of this medicine contact a poison control center or emergency room at once. NOTE: This medicine is only for you. Do not share this medicine with others. What if I miss a dose? If you miss a dose, take it as soon as you can. If it is almost time for your next dose, take only that dose. Do not take double or extra doses. What may interact with this medicine? Do not take this medicine with any of the following medications:  metyrapone  mifepristone This medicine may also interact with the following medications:  aminoglutethimide  amphotericin B  aspirin and aspirin-like medicines  barbiturates  certain medicines for diabetes, like glipizide or glyburide  cholestyramine  cholinesterase inhibitors  cyclosporine  digoxin  diuretics  ephedrine  female hormones, like estrogens and birth control pills  isoniazid  ketoconazole  NSAIDS, medicines for pain and inflammation, like ibuprofen or naproxen  phenytoin  rifampin  toxoids  vaccines  warfarin This list may not describe all possible interactions. Give your health care provider a list of all the medicines, herbs, non-prescription drugs, or dietary  supplements you use. Also tell them if you smoke, drink alcohol, or use illegal drugs. Some items may interact with your medicine. What should I watch for while using  this medicine? Visit your doctor or health care professional for regular checks on your progress. If you are taking this medicine over a prolonged period, carry an identification card with your name and address, the type and dose of your medicine, and your doctor's name and address. This medicine may increase your risk of getting an infection. Tell your doctor or health care professional if you are around anyone with measles or chickenpox, or if you develop sores or blisters that do not heal properly. If you are going to have surgery, tell your doctor or health care professional that you have taken this medicine within the last twelve months. Ask your doctor or health care professional about your diet. You may need to lower the amount of salt you eat. This medicine may increase blood sugar. Ask your healthcare provider if changes in diet or medicines are needed if you have diabetes. What side effects may I notice from receiving this medicine? Side effects that you should report to your doctor or health care professional as soon as possible:  allergic reactions like skin rash, itching or hives, swelling of the face, lips, or tongue  changes in emotions or moods  eye pain   signs and symptoms of high blood sugar such as being more thirsty or hungry or having to urinate more than normal. You may also feel very tired or have blurry vision.  signs and symptoms of infection like fever or chills; cough; sore throat; pain or trouble passing urine  slow growth in children (if used for longer periods of time)  swelling of ankles, feet  trouble sleeping  weak bones (if used for longer periods of time) Side effects that usually do not require medical attention (report to your doctor or health care professional if they continue or are bothersome):  nausea  skin problems, acne, thin and shiny skin  upset stomach  weight gain This list may not describe all possible side effects. Call your doctor  for medical advice about side effects. You may report side effects to FDA at 1-800-FDA-1088. Where should I keep my medicine? Keep out of the reach of children. Store at room temperature between 15 and 30 degrees C (59 and 86 degrees F). Keep container tightly closed. Throw away any unused medicine after the expiration date. NOTE: This sheet is a summary. It may not cover all possible information. If you have questions about this medicine, talk to your doctor, pharmacist, or health care provider.  2020 Elsevier/Gold Standard (2018-09-07 10:30:56)

## 2020-10-02 ENCOUNTER — Other Ambulatory Visit: Payer: Self-pay | Admitting: Family Medicine

## 2020-10-03 ENCOUNTER — Other Ambulatory Visit: Payer: Self-pay | Admitting: Adult Health

## 2020-10-06 DIAGNOSIS — H6123 Impacted cerumen, bilateral: Secondary | ICD-10-CM | POA: Diagnosis not present

## 2020-10-06 DIAGNOSIS — H902 Conductive hearing loss, unspecified: Secondary | ICD-10-CM | POA: Diagnosis not present

## 2020-10-09 DIAGNOSIS — R32 Unspecified urinary incontinence: Secondary | ICD-10-CM | POA: Diagnosis not present

## 2020-10-09 DIAGNOSIS — Z780 Asymptomatic menopausal state: Secondary | ICD-10-CM | POA: Diagnosis not present

## 2020-10-09 DIAGNOSIS — Z881 Allergy status to other antibiotic agents status: Secondary | ICD-10-CM | POA: Diagnosis not present

## 2020-10-09 DIAGNOSIS — Z87891 Personal history of nicotine dependence: Secondary | ICD-10-CM | POA: Diagnosis not present

## 2020-10-09 DIAGNOSIS — Z886 Allergy status to analgesic agent status: Secondary | ICD-10-CM | POA: Diagnosis not present

## 2020-10-09 DIAGNOSIS — Z79890 Hormone replacement therapy (postmenopausal): Secondary | ICD-10-CM | POA: Diagnosis not present

## 2020-10-09 DIAGNOSIS — E785 Hyperlipidemia, unspecified: Secondary | ICD-10-CM | POA: Diagnosis not present

## 2020-10-09 DIAGNOSIS — R030 Elevated blood-pressure reading, without diagnosis of hypertension: Secondary | ICD-10-CM | POA: Diagnosis not present

## 2020-10-09 DIAGNOSIS — H3530 Unspecified macular degeneration: Secondary | ICD-10-CM | POA: Diagnosis not present

## 2020-11-24 ENCOUNTER — Other Ambulatory Visit: Payer: Self-pay | Admitting: Family Medicine

## 2020-11-24 DIAGNOSIS — Z780 Asymptomatic menopausal state: Secondary | ICD-10-CM

## 2021-01-12 ENCOUNTER — Encounter: Payer: Medicare PPO | Admitting: Family Medicine

## 2021-01-23 DIAGNOSIS — H353132 Nonexudative age-related macular degeneration, bilateral, intermediate dry stage: Secondary | ICD-10-CM | POA: Diagnosis not present

## 2021-01-29 DIAGNOSIS — H26492 Other secondary cataract, left eye: Secondary | ICD-10-CM | POA: Diagnosis not present

## 2021-02-23 ENCOUNTER — Other Ambulatory Visit: Payer: Self-pay | Admitting: Family Medicine

## 2021-02-23 DIAGNOSIS — Z780 Asymptomatic menopausal state: Secondary | ICD-10-CM

## 2021-03-31 ENCOUNTER — Other Ambulatory Visit: Payer: Self-pay | Admitting: Family Medicine

## 2021-05-07 ENCOUNTER — Ambulatory Visit (INDEPENDENT_AMBULATORY_CARE_PROVIDER_SITE_OTHER): Payer: Medicare PPO | Admitting: Family Medicine

## 2021-05-07 ENCOUNTER — Other Ambulatory Visit: Payer: Self-pay

## 2021-05-07 ENCOUNTER — Encounter: Payer: Self-pay | Admitting: Family Medicine

## 2021-05-07 VITALS — BP 142/82 | HR 79 | Temp 98.5°F | Resp 16 | Ht 65.0 in | Wt 164.2 lb

## 2021-05-07 DIAGNOSIS — Z1159 Encounter for screening for other viral diseases: Secondary | ICD-10-CM | POA: Diagnosis not present

## 2021-05-07 DIAGNOSIS — M8589 Other specified disorders of bone density and structure, multiple sites: Secondary | ICD-10-CM

## 2021-05-07 DIAGNOSIS — Z862 Personal history of diseases of the blood and blood-forming organs and certain disorders involving the immune mechanism: Secondary | ICD-10-CM

## 2021-05-07 DIAGNOSIS — R739 Hyperglycemia, unspecified: Secondary | ICD-10-CM | POA: Diagnosis not present

## 2021-05-07 DIAGNOSIS — E663 Overweight: Secondary | ICD-10-CM | POA: Diagnosis not present

## 2021-05-07 DIAGNOSIS — E559 Vitamin D deficiency, unspecified: Secondary | ICD-10-CM | POA: Diagnosis not present

## 2021-05-07 DIAGNOSIS — Z0000 Encounter for general adult medical examination without abnormal findings: Secondary | ICD-10-CM | POA: Diagnosis not present

## 2021-05-07 DIAGNOSIS — E7800 Pure hypercholesterolemia, unspecified: Secondary | ICD-10-CM | POA: Diagnosis not present

## 2021-05-07 NOTE — Progress Notes (Signed)
Annual Wellness Visit     Patient: Carol Kelly, Female    DOB: 1941/10/14, 80 y.o.   MRN: [id number] Visit Date: 05/07/2021  Today's Provider: [name], MD   Chief Complaint  Patient presents with  . Medicare Wellness   Subjective     HPI Carol Kelly is a 80 y.o. female who presents today for her Annual Wellness Visit. She reports consuming a general diet. The patient does not participate in regular exercise at present. She generally feels well. She reports sleeping fairly well. She does not have additional problems to discuss today.   Weight  She is concerned about her weight. She was 170lbs so she cut most of her carbs and sweets ans she lost around 30lbs but she has gain some her weight back. She said that when she did change her diet she was experiencing some gastrointestinal issues. She has a hard time monitoring her portions. She has returned back to eat her egg sausage casserole and she loves it. She feels tired of eating the same things but she has had and increase in appetite.   Vaccines She has received her COVID vaccines and 1 booster and she doesn't want to get the 2nd booster. She denies receiving her shingles vaccine.   Last Reported Mammogram 01/15/20 BMD 10/20/15 Colonoscopy 02/19/11 Tdap 04/04/12 Zoster 10/27/11 Prevnar13 08/30/14  Patient Active Problem List   Diagnosis Date Noted  . History of anemia 01/07/2020  . Allergic rhinitis 09/26/2015  . Overweight 09/26/2015  . DD (diverticular disease) 09/26/2015  . Hot flash, menopausal 09/26/2015  . Hypercholesteremia 09/26/2015  . Osteopenia 09/26/2015  . Acne erythematosa 09/26/2015  . Avitaminosis D 09/26/2015  . Post-menopausal 08/19/2015   Past Medical History:  Diagnosis Date  . Allergy   . Cataract    Allergies  Allergen Reactions  . Achromycin [Tetracycline] Hives  . Neosporin  [Neomycin-Bacitracin Zn-Polymyx]     Other reaction(s): Urticaria  . Aspirin Rash        Medications: Outpatient Medications Prior to Visit  Medication Sig  . acetaminophen (TYLENOL) 500 MG tablet Take 500 mg by mouth every 6 (six) hours as needed.  Marland Kitchen atorvastatin (LIPITOR) 10 MG tablet TAKE 1 TABLET BY MOUTH DAILY  . betamethasone dipropionate 0.05 % cream APPLY TOPICALLY TWO TIMES PER DAY FOR 6 DAYS; APPLY A THIN LAYER AS DIRECTED  . Calcium Carb-Cholecalciferol (CALTRATE 600+D3 SOFT) 600-800 MG-UNIT CHEW Chew by mouth 2 (two) times daily.  . cetirizine (ZYRTEC) 10 MG tablet Take 10 mg by mouth daily.  . cycloSPORINE (RESTASIS) 0.05 % ophthalmic emulsion Place 1 drop into both eyes 2 (two) times daily.  Marland Kitchen estradiol (ESTRACE) 0.5 MG tablet TAKE ONE TABLET EVERY DAY  . Multiple Vitamin (MULTIVITAMIN) capsule Take 1 capsule by mouth daily.  . Multiple Vitamins-Minerals (PRESERVISION AREDS 2 PO) Take by mouth 2 (two) times daily.   . predniSONE (STERAPRED UNI-PAK 21 TAB) 10 MG (21) TBPK tablet PO: Take 6 tablets on day 1:Take 5 tablets day 2:Take 4 tablets day 3: Take 3 tablets day 4:Take 2 tablets day five: 5 Take 1 tablet day 6   No facility-administered medications prior to visit.    Allergies  Allergen Reactions  . Achromycin [Tetracycline] Hives  . Neosporin  [Neomycin-Bacitracin Zn-Polymyx]     Other reaction(s): Urticaria  . Aspirin Rash    Patient Care Team: Erasmo Downer, MD as PCP - General (Family Medicine) Lockie Mola, MD as Referring Physician (Ophthalmology) Linus Salmons,  MD as Consulting Physician (Otolaryngology)  Review of Systems  Constitutional: Positive for appetite change and unexpected weight change. Negative for chills, fatigue and fever.  HENT: Negative for ear pain, nosebleeds, rhinorrhea, sinus pressure, sinus pain and sore throat.   Eyes: Negative for pain.  Respiratory: Negative for cough, chest tightness, shortness of breath and wheezing.   Cardiovascular: Negative for chest pain, palpitations and leg swelling.   Gastrointestinal: Negative for abdominal pain, blood in stool, constipation, diarrhea, nausea and vomiting.  Genitourinary: Negative for dysuria, flank pain, frequency, hematuria, pelvic pain and urgency.  Musculoskeletal: Negative for back pain, neck pain and neck stiffness.  Neurological: Negative for dizziness, seizures, syncope, weakness, light-headedness, numbness and headaches.  All other systems reviewed and are negative.       Objective    Vitals: BP (!) 142/82   Pulse 79   Temp 98.5 F (36.9 C) (Oral)   Resp 16   Ht 5\' 5"  (1.651 m)   Wt 164 lb 3.2 oz (74.5 kg)   SpO2 100%   BMI 27.32 kg/m    Physical Exam Vitals reviewed.  Constitutional:      General: She is not in acute distress.    Appearance: Normal appearance. She is well-developed. She is not diaphoretic.  HENT:     Head: Normocephalic and atraumatic.     Right Ear: Tympanic membrane, ear canal and external ear normal.     Left Ear: Tympanic membrane, ear canal and external ear normal.     Nose: Nose normal.     Mouth/Throat:     Mouth: Mucous membranes are moist.     Pharynx: Oropharynx is clear. No oropharyngeal exudate.  Eyes:     General: No scleral icterus.    Conjunctiva/sclera: Conjunctivae normal.     Pupils: Pupils are equal, round, and reactive to light.  Neck:     Thyroid: No thyromegaly.  Cardiovascular:     Rate and Rhythm: Normal rate and regular rhythm.     Pulses: Normal pulses.     Heart sounds: Normal heart sounds. No murmur heard.   Pulmonary:     Effort: Pulmonary effort is normal. No respiratory distress.     Breath sounds: Normal breath sounds. No wheezing or rales.  Abdominal:     General: There is no distension.     Palpations: Abdomen is soft.     Tenderness: There is no abdominal tenderness.  Musculoskeletal:        General: No deformity.     Cervical back: Neck supple.     Right lower leg: No edema.     Left lower leg: No edema.  Lymphadenopathy:     Cervical: No  cervical adenopathy.  Skin:    General: Skin is warm and dry.     Findings: No rash.  Neurological:     Mental Status: She is alert and oriented to person, place, and time. Mental status is at baseline.     Sensory: No sensory deficit.     Motor: No weakness.     Gait: Gait normal.  Psychiatric:        Mood and Affect: Mood normal.        Behavior: Behavior normal.        Thought Content: Thought content normal.     Most recent functional status assessment: In your present state of health, do you have any difficulty performing the following activities: 05/07/2021  Hearing? N  Vision? N  Difficulty concentrating or making decisions?  N  Walking or climbing stairs? N  Dressing or bathing? N  Doing errands, shopping? N  Some recent data might be hidden   Most recent fall risk assessment: Fall Risk  05/07/2021  Falls in the past year? 0  Number falls in past yr: 0  Injury with Fall? 0    Most recent depression screenings: PHQ 2/9 Scores 05/07/2021 01/07/2020  PHQ - 2 Score 0 0  PHQ- 9 Score 4 -   Most recent cognitive screening: 6CIT Screen 10/01/2016  What Year? 0 points  What month? 0 points  What time? 0 points  Count back from 20 0 points  Months in reverse 0 points  Repeat phrase 0 points  Total Score 0   Most recent Audit-C alcohol use screening Alcohol Use Disorder Test (AUDIT) 05/07/2021  1. How often do you have a drink containing alcohol? 0  2. How many drinks containing alcohol do you have on a typical day when you are drinking? 0  3. How often do you have six or more drinks on one occasion? 0  AUDIT-C Score 0   A score of 3 or more in women, and 4 or more in men indicates increased risk for alcohol abuse, EXCEPT if all of the points are from question 1   No results found for any visits on 05/07/21.  Assessment & Plan     Problem List Items Addressed This Visit      Musculoskeletal and Integument   Osteopenia    Declines repeat DEXA - will address  again next year        Other   Overweight    Discussed importance of healthy weight management Discussed diet and exercise       Relevant Orders   Lipid panel   Comprehensive metabolic panel   CBC w/Diff/Platelet   Hemoglobin A1c   Hypercholesteremia    Previously well controlled Continue statin Repeat FLP and CMP      Relevant Orders   Lipid panel   Comprehensive metabolic panel   Avitaminosis D   Relevant Orders   VITAMIN D 25 Hydroxy (Vit-D Deficiency, Fractures)   History of anemia   Relevant Orders   CBC w/Diff/Platelet    Other Visit Diagnoses    Encounter for annual wellness visit (AWV) in Medicare patient    -  Primary   Encounter for annual physical exam       Need for hepatitis C screening test       Relevant Orders   Hepatitis C Antibody   Hyperglycemia       Relevant Orders   Hemoglobin A1c      Annual wellness visit done today including the all of the following: Reviewed patient's Family Medical History Reviewed and updated list of patient's medical providers Assessment of cognitive impairment was done Assessed patient's functional ability Established a written schedule for health screening services Health Risk Assessent Completed and Reviewed  Exercise Activities and Dietary recommendations Goals    . Exercise 150 minutes per week (moderate activity)     Pt to start back walking 5 days a week for 30 minutes at a time.     . Reduce portion size     Starting 10/01/16, I will cut back on my portion sizes and fatty foods.       Immunization History  Administered Date(s) Administered  . Influenza, High Dose Seasonal PF 10/13/2020  . Influenza,inj,Quad PF,6+ Mos 08/30/2014, 09/26/2019  . Influenza-Unspecified 09/15/2016, 09/23/2017, 10/13/2018  .  PFIZER(Purple Top)SARS-COV-2 Vaccination 12/27/2019, 01/17/2020  . Pneumococcal Conjugate-13 08/30/2014  . Pneumococcal Polysaccharide-23 06/27/2013, 06/27/2014  . Tdap 04/04/2012  . Zoster  10/27/2011    Health Maintenance  Topic Date Due  . Hepatitis C Screening  Never done  . COVID-19 Vaccine (3 - Booster for Pfizer series) 06/16/2020  . DEXA SCAN  10/19/2020  . INFLUENZA VACCINE  07/20/2021  . TETANUS/TDAP  04/04/2022  . PNA vac Low Risk Adult  Completed  . HPV VACCINES  Aged Out     Discussed health benefits of physical activity, and encouraged her to engage in regular exercise appropriate for her age and condition.      Return in about 1 year (around 05/07/2022) for CPE, AWV.      I,Essence Turner,acting as a Neurosurgeonscribe for Shirlee LatchAngela Gissele Narducci, MD.,have documented all relevant documentation on the behalf of Shirlee LatchAngela Briza Bark, MD,as directed by  Shirlee LatchAngela Eulalah Rupert, MD while in the presence of Shirlee LatchAngela Korrina Zern, MD.   I, Shirlee LatchAngela Namiko Pritts, MD, have reviewed all documentation for this visit. The documentation on 05/07/21 for the exam, diagnosis, procedures, and orders are all accurate and complete.   Milam Allbaugh, Marzella SchleinAngela M, MD, MPH [facility]

## 2021-05-07 NOTE — Assessment & Plan Note (Signed)
Previously well controlled Continue statin Repeat FLP and CMP  

## 2021-05-07 NOTE — Assessment & Plan Note (Signed)
Discussed importance of healthy weight management Discussed diet and exercise  

## 2021-05-07 NOTE — Patient Instructions (Addendum)
The CDC recommends two doses of Shingrix (the shingles vaccine) separated by 2 to 6 months for adults age 79 years and older. I recommend checking with your insurance plan regarding coverage for this vaccine.      Health Maintenance After Age 58 After age 17, you are at a higher risk for certain long-term diseases and infections as well as injuries from falls. Falls are a major cause of broken bones and head injuries in people who are older than age 27. Getting regular preventive care can help to keep you healthy and well. Preventive care includes getting regular testing and making lifestyle changes as recommended by your health care provider. Talk with your health care provider about:  Which screenings and tests you should have. A screening is a test that checks for a disease when you have no symptoms.  A diet and exercise plan that is right for you. What should I know about screenings and tests to prevent falls? Screening and testing are the best ways to find a health problem early. Early diagnosis and treatment give you the best chance of managing medical conditions that are common after age 24. Certain conditions and lifestyle choices may make you more likely to have a fall. Your health care provider may recommend:  Regular vision checks. Poor vision and conditions such as cataracts can make you more likely to have a fall. If you wear glasses, make sure to get your prescription updated if your vision changes.  Medicine review. Work with your health care provider to regularly review all of the medicines you are taking, including over-the-counter medicines. Ask your health care provider about any side effects that may make you more likely to have a fall. Tell your health care provider if any medicines that you take make you feel dizzy or sleepy.  Osteoporosis screening. Osteoporosis is a condition that causes the bones to get weaker. This can make the bones weak and cause them to break more  easily.  Blood pressure screening. Blood pressure changes and medicines to control blood pressure can make you feel dizzy.  Strength and balance checks. Your health care provider may recommend certain tests to check your strength and balance while standing, walking, or changing positions.  Foot health exam. Foot pain and numbness, as well as not wearing proper footwear, can make you more likely to have a fall.  Depression screening. You may be more likely to have a fall if you have a fear of falling, feel emotionally low, or feel unable to do activities that you used to do.  Alcohol use screening. Using too much alcohol can affect your balance and may make you more likely to have a fall. What actions can I take to lower my risk of falls? General instructions  Talk with your health care provider about your risks for falling. Tell your health care provider if: ? You fall. Be sure to tell your health care provider about all falls, even ones that seem minor. ? You feel dizzy, sleepy, or off-balance.  Take over-the-counter and prescription medicines only as told by your health care provider. These include any supplements.  Eat a healthy diet and maintain a healthy weight. A healthy diet includes low-fat dairy products, low-fat (lean) meats, and fiber from whole grains, beans, and lots of fruits and vegetables. Home safety  Remove any tripping hazards, such as rugs, cords, and clutter.  Install safety equipment such as grab bars in bathrooms and safety rails on stairs.  Keep rooms  and walkways well-lit. Activity  Follow a regular exercise program to stay fit. This will help you maintain your balance. Ask your health care provider what types of exercise are appropriate for you.  If you need a cane or walker, use it as recommended by your health care provider.  Wear supportive shoes that have nonskid soles.   Lifestyle  Do not drink alcohol if your health care provider tells you not to  drink.  If you drink alcohol, limit how much you have: ? 0-1 drink a day for women. ? 0-2 drinks a day for men.  Be aware of how much alcohol is in your drink. In the [place] one drink equals one typical bottle of beer (12 oz), one-half glass of wine (5 oz), or one shot of hard liquor (1 oz).  Do not use any products that contain nicotine or tobacco, such as cigarettes and e-cigarettes. If you need help quitting, ask your health care provider. Summary  Having a healthy lifestyle and getting preventive care can help to protect your health and wellness after age 78.  Screening and testing are the best way to find a health problem early and help you avoid having a fall. Early diagnosis and treatment give you the best chance for managing medical conditions that are more common for people who are older than age 26.  Falls are a major cause of broken bones and head injuries in people who are older than age 41. Take precautions to prevent a fall at home.  Work with your health care provider to learn what changes you can make to improve your health and wellness and to prevent falls. This information is not intended to replace advice given to you by your health care provider. Make sure you discuss any questions you have with your health care provider. Document Revised: 03/29/2019 Document Reviewed: 10/19/2017 Elsevier Patient Education  2021 [employer].

## 2021-05-07 NOTE — Assessment & Plan Note (Signed)
Declines repeat DEXA - will address again next year

## 2021-05-08 LAB — COMPREHENSIVE METABOLIC PANEL
ALT: 9 IU/L (ref 0–32)
AST: 12 IU/L (ref 0–40)
Albumin/Globulin Ratio: 2.5 — ABNORMAL HIGH (ref 1.2–2.2)
Albumin: 4.7 g/dL (ref 3.7–4.7)
Alkaline Phosphatase: 83 IU/L (ref 44–121)
BUN/Creatinine Ratio: 19 (ref 12–28)
BUN: 15 mg/dL (ref 8–27)
Bilirubin Total: 0.4 mg/dL (ref 0.0–1.2)
CO2: 23 mmol/L (ref 20–29)
Calcium: 9.7 mg/dL (ref 8.7–10.3)
Chloride: 103 mmol/L (ref 96–106)
Creatinine, Ser: 0.78 mg/dL (ref 0.57–1.00)
Globulin, Total: 1.9 g/dL (ref 1.5–4.5)
Glucose: 106 mg/dL — ABNORMAL HIGH (ref 65–99)
Potassium: 4.5 mmol/L (ref 3.5–5.2)
Sodium: 143 mmol/L (ref 134–144)
Total Protein: 6.6 g/dL (ref 6.0–8.5)
eGFR: 78 mL/min/{1.73_m2} (ref >59)

## 2021-05-08 LAB — CBC WITH DIFFERENTIAL/PLATELET
Basophils Absolute: 0.1 10*3/uL (ref 0.0–0.2)
Basos: 1 %
EOS (ABSOLUTE): 0.5 10*3/uL — ABNORMAL HIGH (ref 0.0–0.4)
Eos: 9 %
Hematocrit: 34.8 % (ref 34.0–46.6)
Hemoglobin: 12 g/dL (ref 11.1–15.9)
Immature Grans (Abs): 0 10*3/uL (ref 0.0–0.1)
Immature Granulocytes: 0 %
Lymphocytes Absolute: 2.1 10*3/uL (ref 0.7–3.1)
Lymphs: 35 %
MCH: 32.3 pg (ref 26.6–33.0)
MCHC: 34.5 g/dL (ref 31.5–35.7)
MCV: 94 fL (ref 79–97)
Monocytes Absolute: 0.5 10*3/uL (ref 0.1–0.9)
Monocytes: 9 %
Neutrophils Absolute: 2.7 10*3/uL (ref 1.4–7.0)
Neutrophils: 46 %
Platelets: 256 10*3/uL (ref 150–450)
RBC: 3.72 x10E6/uL — ABNORMAL LOW (ref 3.77–5.28)
RDW: 12.1 % (ref 11.7–15.4)
WBC: 5.9 10*3/uL (ref 3.4–10.8)

## 2021-05-08 LAB — LIPID PANEL
Chol/HDL Ratio: 3.4 ratio (ref 0.0–4.4)
Cholesterol, Total: 227 mg/dL — ABNORMAL HIGH (ref 100–199)
HDL: 66 mg/dL (ref >39)
LDL Chol Calc (NIH): 132 mg/dL — ABNORMAL HIGH (ref 0–99)
Triglycerides: 165 mg/dL — ABNORMAL HIGH (ref 0–149)
VLDL Cholesterol Cal: 29 mg/dL (ref 5–40)

## 2021-05-08 LAB — HEPATITIS C ANTIBODY: Hep C Virus Ab: <0.1 s/co ratio (ref 0.0–0.9)

## 2021-05-08 LAB — HEMOGLOBIN A1C
Est. average glucose Bld gHb Est-mCnc: 111 mg/dL
Hgb A1c MFr Bld: 5.5 % (ref 4.8–5.6)

## 2021-05-08 LAB — VITAMIN D 25 HYDROXY (VIT D DEFICIENCY, FRACTURES): Vit D, 25-Hydroxy: 48 ng/mL (ref 30.0–100.0)

## 2021-05-21 ENCOUNTER — Other Ambulatory Visit: Payer: Self-pay | Admitting: Family Medicine

## 2021-05-21 DIAGNOSIS — Z780 Asymptomatic menopausal state: Secondary | ICD-10-CM

## 2021-05-21 NOTE — Telephone Encounter (Signed)
Requested medication (s) are due for refill today: yes  Requested medication (s) are on the active medication list: yes   Last refill:  02/23/2021  Future visit scheduled:yes   Notes to clinic:  Protocol failed:Mammogram is up-to-date per Health Maintenance    Requested Prescriptions  Pending Prescriptions Disp Refills   estradiol (ESTRACE) 0.5 MG tablet [Pharmacy Med Name: ESTRADIOL 0.5 MG TAB] 90 tablet 0    Sig: TAKE ONE TABLET EVERY DAY      OB/GYN:  Estrogens Failed - 05/21/2021 11:34 AM      Failed - Mammogram is up-to-date per Health Maintenance      Failed - Last BP in normal range    BP Readings from Last 1 Encounters:  05/07/21 (!) 142/82          Passed - Valid encounter within last 12 months    Recent Outpatient Visits           2 weeks ago Encounter for annual wellness visit (AWV) in Medicare patient   [facility], Marzella Schlein, MD   7 months ago Contact dermatitis due to other agent, unspecified contact dermatitis type   [facility], Eula Fried, FNP   1 year ago Encounter for annual physical exam   [facility], Marzella Schlein, MD   2 years ago Encounter for annual physical exam   [facility], Marzella Schlein, MD   3 years ago Encounter for annual physical exam   [facility], Marzella Schlein, MD

## 2021-06-29 ENCOUNTER — Other Ambulatory Visit: Payer: Self-pay | Admitting: Family Medicine

## 2021-06-29 NOTE — Telephone Encounter (Signed)
Next physical exam due in 3 months

## 2021-08-18 ENCOUNTER — Other Ambulatory Visit: Payer: Self-pay | Admitting: Family Medicine

## 2021-08-18 DIAGNOSIS — Z780 Asymptomatic menopausal state: Secondary | ICD-10-CM

## 2021-08-19 NOTE — Telephone Encounter (Signed)
Requested medications are due for refill today.  yes  Requested medications are on the active medications list.  yes  Last refill. 05/21/2021  Future visit scheduled.   yes  Notes to clinic.  Failed protocol.

## 2021-09-01 DIAGNOSIS — H353132 Nonexudative age-related macular degeneration, bilateral, intermediate dry stage: Secondary | ICD-10-CM | POA: Diagnosis not present

## 2021-09-17 DIAGNOSIS — Z833 Family history of diabetes mellitus: Secondary | ICD-10-CM | POA: Diagnosis not present

## 2021-09-17 DIAGNOSIS — Z90710 Acquired absence of both cervix and uterus: Secondary | ICD-10-CM | POA: Diagnosis not present

## 2021-09-17 DIAGNOSIS — H04129 Dry eye syndrome of unspecified lacrimal gland: Secondary | ICD-10-CM | POA: Diagnosis not present

## 2021-09-17 DIAGNOSIS — Z823 Family history of stroke: Secondary | ICD-10-CM | POA: Diagnosis not present

## 2021-09-17 DIAGNOSIS — E785 Hyperlipidemia, unspecified: Secondary | ICD-10-CM | POA: Diagnosis not present

## 2021-09-17 DIAGNOSIS — Z886 Allergy status to analgesic agent status: Secondary | ICD-10-CM | POA: Diagnosis not present

## 2021-09-17 DIAGNOSIS — Z79890 Hormone replacement therapy (postmenopausal): Secondary | ICD-10-CM | POA: Diagnosis not present

## 2021-09-28 ENCOUNTER — Other Ambulatory Visit: Payer: Self-pay | Admitting: Family Medicine

## 2021-11-03 DIAGNOSIS — H6123 Impacted cerumen, bilateral: Secondary | ICD-10-CM | POA: Diagnosis not present

## 2021-11-03 DIAGNOSIS — H902 Conductive hearing loss, unspecified: Secondary | ICD-10-CM | POA: Diagnosis not present

## 2022-03-05 DIAGNOSIS — H353132 Nonexudative age-related macular degeneration, bilateral, intermediate dry stage: Secondary | ICD-10-CM | POA: Diagnosis not present

## 2022-03-15 DIAGNOSIS — Z823 Family history of stroke: Secondary | ICD-10-CM | POA: Diagnosis not present

## 2022-03-15 DIAGNOSIS — T7840XD Allergy, unspecified, subsequent encounter: Secondary | ICD-10-CM | POA: Diagnosis not present

## 2022-03-15 DIAGNOSIS — Z7722 Contact with and (suspected) exposure to environmental tobacco smoke (acute) (chronic): Secondary | ICD-10-CM | POA: Diagnosis not present

## 2022-03-15 DIAGNOSIS — Z79890 Hormone replacement therapy (postmenopausal): Secondary | ICD-10-CM | POA: Diagnosis not present

## 2022-03-15 DIAGNOSIS — Z6827 Body mass index (BMI) 27.0-27.9, adult: Secondary | ICD-10-CM | POA: Diagnosis not present

## 2022-03-15 DIAGNOSIS — E785 Hyperlipidemia, unspecified: Secondary | ICD-10-CM | POA: Diagnosis not present

## 2022-03-15 DIAGNOSIS — R030 Elevated blood-pressure reading, without diagnosis of hypertension: Secondary | ICD-10-CM | POA: Diagnosis not present

## 2022-03-15 DIAGNOSIS — E663 Overweight: Secondary | ICD-10-CM | POA: Diagnosis not present

## 2022-03-15 DIAGNOSIS — H3530 Unspecified macular degeneration: Secondary | ICD-10-CM | POA: Diagnosis not present

## 2022-03-29 ENCOUNTER — Telehealth: Payer: Medicare PPO | Admitting: Family Medicine

## 2022-03-29 ENCOUNTER — Other Ambulatory Visit: Payer: Self-pay | Admitting: Family Medicine

## 2022-03-29 DIAGNOSIS — J3089 Other allergic rhinitis: Secondary | ICD-10-CM

## 2022-03-29 MED ORDER — AZELASTINE-FLUTICASONE 137-50 MCG/ACT NA SUSP: 1.0000 | Freq: Two times a day (BID) | NASAL | 0 refills | Status: DC

## 2022-03-29 NOTE — Progress Notes (Signed)
?Virtual Visit Consent  ? ?[name], you are scheduled for a virtual visit with a [facility] provider today.   ?  ?Just as with appointments in the office, your consent must be obtained to participate.  Your consent will be active for this visit and any virtual visit you may have with one of our providers in the next 365 days.   ?  ?If you have a MyChart account, a copy of this consent can be sent to you electronically.  All virtual visits are billed to your insurance company just like a traditional visit in the office.   ? ?As this is a virtual visit, video technology does not allow for your provider to perform a traditional examination.  This may limit your provider's ability to fully assess your condition.  If your provider identifies any concerns that need to be evaluated in person or the need to arrange testing (such as labs, EKG, etc.), we will make arrangements to do so.   ?  ?Although advances in technology are sophisticated, we cannot ensure that it will always work on either your end or our end.  If the connection with a video visit is poor, the visit may have to be switched to a telephone visit.  With either a video or telephone visit, we are not always able to ensure that we have a secure connection.    ? ?I need to obtain your verbal consent now.   Are you willing to proceed with your visit today?  ?  ?Carol Kelly has provided verbal consent on 03/29/2022 for a virtual visit (video or telephone). ?  ?Freddy Finner, NP  ? ?Date: 03/29/2022 9:32 AM ? ? ?Virtual Visit via Video Note  ? ?Dillard Cannon, connected with  Carol Kelly  ([id number], 1941-01-05) on 03/29/22 at  9:30 AM EDT by a video-enabled telemedicine application and verified that I am speaking with the correct person using two identifiers. ? ?Location: ?Patient: Virtual Visit Location Patient: Home ?Provider: Virtual Visit Location Provider: Home Office ?  ?I discussed the limitations of evaluation and management by  telemedicine and the availability of in person appointments. The patient expressed understanding and agreed to proceed.   ? ?History of Present Illness: ?Carol Kelly is a 81 y.o. who identifies as a female who was assigned female at birth, and is being seen today for sinus infection. ? ?HPI: Sinus Problem ?This is a new problem. The current episode started in the past 7 days. The problem has been gradually worsening since onset. There has been no fever. The pain is moderate. Associated symptoms include congestion, a hoarse voice, sinus pressure and sneezing. Past treatments include acetaminophen and oral decongestants. The treatment provided moderate relief.  Was sweeping her porch on Friday and kicked up a lot of pollen within hours she started feeling bad. Mucus and congestion.  ?Problems:  ?Patient Active Problem List  ? Diagnosis Date Noted  ? History of anemia 01/07/2020  ? Allergic rhinitis 09/26/2015  ? Overweight 09/26/2015  ? DD (diverticular disease) 09/26/2015  ? Hot flash, menopausal 09/26/2015  ? Hypercholesteremia 09/26/2015  ? Osteopenia 09/26/2015  ? Acne erythematosa 09/26/2015  ? Avitaminosis D 09/26/2015  ? Post-menopausal 08/19/2015  ?  ?Allergies:  ?Allergies  ?Allergen Reactions  ? Achromycin [Tetracycline] Hives  ? Neosporin  [Neomycin-Bacitracin Zn-Polymyx]   ?  Other reaction(s): Urticaria  ? Aspirin Rash  ? ?Medications:  ?Current Outpatient Medications:  ?  acetaminophen (TYLENOL) 500  MG tablet, Take 500 mg by mouth every 6 (six) hours as needed., Disp: , Rfl:  ?  atorvastatin (LIPITOR) 10 MG tablet, TAKE 1 TABLET BY MOUTH DAILY, Disp: 90 tablet, Rfl: 1 ?  betamethasone dipropionate 0.05 % cream, APPLY TOPICALLY TWO TIMES PER DAY FOR 6 DAYS; APPLY A THIN LAYER AS DIRECTED, Disp: 15 g, Rfl: 0 ?  Calcium Carb-Cholecalciferol (CALTRATE 600+D3 SOFT) 600-800 MG-UNIT CHEW, Chew by mouth 2 (two) times daily., Disp: , Rfl:  ?  cetirizine (ZYRTEC) 10 MG tablet, Take 10 mg by mouth daily.,  Disp: , Rfl:  ?  cycloSPORINE (RESTASIS) 0.05 % ophthalmic emulsion, Place 1 drop into both eyes 2 (two) times daily., Disp: , Rfl:  ?  estradiol (ESTRACE) 0.5 MG tablet, TAKE ONE TABLET EVERY DAY, Disp: 90 tablet, Rfl: 3 ?  Multiple Vitamin (MULTIVITAMIN) capsule, Take 1 capsule by mouth daily., Disp: , Rfl:  ?  Multiple Vitamins-Minerals (PRESERVISION AREDS 2 PO), Take by mouth 2 (two) times daily. , Disp: , Rfl:  ?  predniSONE (STERAPRED UNI-PAK 21 TAB) 10 MG (21) TBPK tablet, PO: Take 6 tablets on day 1:Take 5 tablets day 2:Take 4 tablets day 3: Take 3 tablets day 4:Take 2 tablets day five: 5 Take 1 tablet day 6, Disp: 21 tablet, Rfl: 0 ? ?Observations/Objective: ?Patient is well-developed, well-nourished in no acute distress.  ?Resting comfortably  at home.  ?Head is normocephalic, atraumatic.  ?No labored breathing.  ?Speech is clear and coherent with logical content.  ?Patient is alert and oriented at baseline.  ? ?Congestion nasal tone ? ?Assessment and Plan: ? ?1. Environmental and seasonal allergies ? ?- Azelastine-Fluticasone 137-50 MCG/ACT SUSP; Place 1 spray into the nose every 12 (twelve) hours.  Dispense: 23 g; Refill: 0 ? ?S&S are consistent with exposure to allergen as reported ?Combo nasal spray provided to help clear out sinuses ?Education and info on allergies and treatment reviewed and on avs ?Explain this is not a sinus infection as of yet- and does not warrant antibiotics at this time. ? ? Reviewed side effects, risks and benefits of medication.   ? ?Patient acknowledged agreement and understanding of the plan.  ? ? ? ?Follow Up Instructions: ?I discussed the assessment and treatment plan with the patient. The patient was provided an opportunity to ask questions and all were answered. The patient agreed with the plan and demonstrated an understanding of the instructions.  A copy of instructions were sent to the patient via MyChart unless otherwise noted below.  ? ? ? ?The patient was advised  to call back or seek an in-person evaluation if the symptoms worsen or if the condition fails to improve as anticipated. ? ?Time:  ?I spent 12 minutes with the patient via telehealth technology discussing the above problems/concerns.   ? ?Freddy Finner, NP ? ?

## 2022-03-29 NOTE — Patient Instructions (Signed)
Allergies, Adult ?An allergy means that your body reacts to something that bothers it (allergen). This can happen from something that you eat, breathe in, or touch. ?Allergies often affect the nose, eyes, skin, and stomach. They can be mild, moderate, or very bad (severe). An allergy cannot spread from person to person. They can happen at any age. Sometimes, people outgrow them. ?What are the causes? ?Outdoor things, such as pollen, car fumes, and mold. ?Indoor things, such as dust, smoke, mold, and pets. ?Foods. ?Medicines. ?Things that bother your skin, such as perfume and bug bites. ?What increases the risk? ?Having family members with allergies or asthma. ?What are the signs or symptoms? ?Symptoms depend on how bad your allergy is. ?Mild to moderate symptoms ?Runny nose, stuffy nose, or sneezing. ?Itchy mouth, ears, or throat. ?A feeling of mucus dripping down the back of your throat. ?Sore throat. ?Eyes that are itchy, red, watery, or puffy. ?A skin rash, or red, swollen areas of skin (hives). ?Stomach cramps or bloating. ?Severe symptoms ?Very bad allergies to food, medicine, or bug bites may cause a very bad allergy reaction (anaphylaxis). This can be life-threatening. Symptoms include: ?A red face. ?Wheezing or coughing. ?Swollen lips, tongue, or mouth. ?Tight or swollen throat. ?Chest pain or tightness, or a fast heartbeat. ?Trouble breathing or shortness of breath. ?Pain in your belly (abdomen), vomiting, or watery poop (diarrhea). ?Feeling dizzy or fainting. ?How is this treated? ?  ?Treatment for this condition depends on your symptoms. Treatment may include: ?Cold, wet cloths for itching and swelling. ?Eye drops, nose sprays, or skin creams. ?Washing out your nose each day. ?A humidifier. ?Medicines. ?A change to the foods you eat. ?Being exposed again and again to tiny amounts of allergens. This helps your body get used to them. You might have: ?Allergy shots. ?Very small amounts of allergen put under  your tongue. ?An emergency shot (auto-injector pen) if you have a very bad allergy reaction. ?This is a medicine with a needle. You can put it into your skin by yourself. ?Your doctor will teach you how to use it. ?Follow these instructions at home: ?Medicines ? ?Take or apply over-the-counter and prescription medicines only as told by your doctor. ?If you are at risk for a very bad allergy reaction, keep an auto-injector pen with you all the time. ?Eating and drinking ?Follow instructions from your doctor about what to eat and drink. ?Drink enough fluid to keep your pee (urine) pale yellow. ?General instructions ?If you have ever had a very bad allergy reaction, wear a medical alert bracelet or necklace. ?Stay away from things that you are allergic to. ?Keep all follow-up visits as told by your doctor. This is important. ?Contact a doctor if: ?Your symptoms do not get better with treatment. ?Get help right away if: ?You have symptoms of a very bad allergy reaction. These include: ?A swollen mouth, tongue, or throat. ?Pain or tightness in your chest. ?Trouble breathing. ?Being short of breath. ?Dizziness. ?Fainting. ?Very bad pain in your belly. ?Vomiting. ?Watery poop. ?These symptoms may be an emergency. Do not wait to see if the symptoms will go away. Get medical help right away. Call your local emergency services (911 in the [place] Do not drive yourself to the hospital. ?Summary ?Take or apply over-the-counter and prescription medicines only as told by your doctor. ?Stay away from things you are allergic to. ?If you are at risk for a very bad allergy reaction, carry an auto-injector pen all the time. ?Wear a   medical alert bracelet or necklace. ?Very bad allergy reactions can be life-threatening. Get help right away. ?This information is not intended to replace advice given to you by your health care provider. Make sure you discuss any questions you have with your health care provider. ?Document Revised:  10/17/2019 Document Reviewed: 10/17/2019 ?Elsevier Patient Education ? 2022 Elsevier Inc. ? ?

## 2022-05-10 NOTE — Progress Notes (Unsigned)
I,Carol Kelly S Maybell Misenheimer,acting as a Neurosurgeon for Carol Latch, MD.,have documented all relevant documentation on the behalf of Carol Latch, MD,as directed by  Carol Latch, MD while in the presence of Carol Latch, MD.   Annual Wellness Visit     Patient: Carol Kelly, Female    DOB: [birth year], 81 y.o.   MRN: [id number] Visit Date: 05/11/2022  Today's Provider: [name], MD   No chief complaint on file.  Subjective    [name] is a 81 y.o. female who presents today for her Annual Wellness Visit. She reports consuming a {diet types:17450} diet. {Exercise:19826} She generally feels {well/fairly well/poorly:18703}. She reports sleeping {well/fairly well/poorly:18703}. She {does/does not:200015} have additional problems to discuss today.   HPI    Medications: Outpatient Medications Prior to Visit  Medication Sig   acetaminophen (TYLENOL) 500 MG tablet Take 500 mg by mouth every 6 (six) hours as needed.   atorvastatin (LIPITOR) 10 MG tablet TAKE 1 TABLET BY MOUTH DAILY   Azelastine-Fluticasone 137-50 MCG/ACT SUSP Place 1 spray into the nose every 12 (twelve) hours.   betamethasone dipropionate 0.05 % cream APPLY TOPICALLY TWO TIMES PER DAY FOR 6 DAYS; APPLY A THIN LAYER AS DIRECTED   Calcium Carb-Cholecalciferol (CALTRATE 600+D3 SOFT) 600-800 MG-UNIT CHEW Chew by mouth 2 (two) times daily.   cetirizine (ZYRTEC) 10 MG tablet Take 10 mg by mouth daily.   cycloSPORINE (RESTASIS) 0.05 % ophthalmic emulsion Place 1 drop into both eyes 2 (two) times daily.   estradiol (ESTRACE) 0.5 MG tablet TAKE ONE TABLET EVERY DAY   Multiple Vitamin (MULTIVITAMIN) capsule Take 1 capsule by mouth daily.   Multiple Vitamins-Minerals (PRESERVISION AREDS 2 PO) Take by mouth 2 (two) times daily.    predniSONE (STERAPRED UNI-PAK 21 TAB) 10 MG (21) TBPK tablet PO: Take 6 tablets on day 1:Take 5 tablets day 2:Take 4 tablets day 3: Take 3 tablets day 4:Take 2 tablets day  five: 5 Take 1 tablet day 6   No facility-administered medications prior to visit.    Allergies  Allergen Reactions   Achromycin [Tetracycline] Hives   Neosporin  [Neomycin-Bacitracin Zn-Polymyx]     Other reaction(s): Urticaria   Aspirin Rash    Patient Care Team: Erasmo Downer, MD as PCP - General (Family Medicine) Lockie Mola, MD as Referring Physician (Ophthalmology) Linus Salmons, MD as Consulting Physician (Otolaryngology)  Review of Systems  All other systems reviewed and are negative.  Last CBC Lab Results  Component Value Date   WBC 5.9 05/07/2021   HGB 12.0 05/07/2021   HCT 34.8 05/07/2021   MCV 94 05/07/2021   MCH 32.3 05/07/2021   RDW 12.1 05/07/2021   PLT 256 05/07/2021   Last metabolic panel Lab Results  Component Value Date   GLUCOSE 106 (H) 05/07/2021   NA 143 05/07/2021   K 4.5 05/07/2021   CL 103 05/07/2021   CO2 23 05/07/2021   BUN 15 05/07/2021   CREATININE 0.78 05/07/2021   EGFR 78 05/07/2021   CALCIUM 9.7 05/07/2021   PROT 6.6 05/07/2021   ALBUMIN 4.7 05/07/2021   LABGLOB 1.9 05/07/2021   AGRATIO 2.5 (H) 05/07/2021   BILITOT 0.4 05/07/2021   ALKPHOS 83 05/07/2021   AST 12 05/07/2021   ALT 9 05/07/2021   Last lipids Lab Results  Component Value Date   CHOL 227 (H) 05/07/2021   HDL 66 05/07/2021   LDLCALC 132 (H) 05/07/2021   TRIG 165 (H) 05/07/2021   CHOLHDL 3.4 05/07/2021  Last hemoglobin A1c Lab Results  Component Value Date   HGBA1C 5.5 05/07/2021   Last thyroid functions Lab Results  Component Value Date   TSH 4.130 10/12/2016   Last vitamin D Lab Results  Component Value Date   VD25OH 48.0 05/07/2021        Objective    Vitals: There were no vitals taken for this visit. BP Readings from Last 3 Encounters:  05/07/21 (!) 142/82  09/24/20 133/75  01/07/20 136/70   Wt Readings from Last 3 Encounters:  05/07/21 164 lb 3.2 oz (74.5 kg)  09/24/20 156 lb 6.4 oz (70.9 kg)  01/07/20 170 lb  12.8 oz (77.5 kg)       Physical Exam ***  Most recent functional status assessment:    05/07/2022   10:28 PM  In your present state of health, do you have any difficulty performing the following activities:  Hearing? 0  Vision? 0  Difficulty concentrating or making decisions? 0  Walking or climbing stairs? 0  Dressing or bathing? 0  Doing errands, shopping? 0  Preparing Food and eating ? N  Using the Toilet? N  In the past six months, have you accidently leaked urine? Y  Do you have problems with loss of bowel control? N  Managing your Medications? N  Managing your Finances? N  Housekeeping or managing your Housekeeping? N   Most recent fall risk assessment:    05/07/2022   10:28 PM  [place] in the past year? 0    Most recent depression screenings:    05/07/2021   10:01 AM 01/07/2020   10:25 AM  PHQ 2/9 Scores  PHQ - 2 Score 0 0  PHQ- 9 Score 4    Most recent cognitive screening:    10/01/2016    9:39 AM  6CIT Screen  What Year? 0 points  What month? 0 points  What time? 0 points  Count back from 20 0 points  Months in reverse 0 points  Repeat phrase 0 points  Total Score 0 points   Most recent Audit-C alcohol use screening    05/07/2022   10:28 PM  Alcohol Use Disorder Test (AUDIT)  1. How often do you have a drink containing alcohol? 0  3. How often do you have six or more drinks on one occasion? 0   A score of 3 or more in women, and 4 or more in men indicates increased risk for alcohol abuse, EXCEPT if all of the points are from question 1   No results found for any visits on 05/11/22.  Assessment & Plan     Annual wellness visit done today including the all of the following: Reviewed patient's Family Medical History Reviewed and updated list of patient's medical providers Assessment of cognitive impairment was done Assessed patient's functional ability Established a written schedule for health screening services Health Risk  Assessent Completed and Reviewed  Exercise Activities and Dietary recommendations  Goals      Exercise 150 minutes per week (moderate activity)     Pt to start back walking 5 days a week for 30 minutes at a time.       Reduce portion size     Starting 10/01/16, I will cut back on my portion sizes and fatty foods.         Immunization History  Administered Date(s) Administered   Influenza, High Dose Seasonal PF 10/13/2020, 09/29/2021   Influenza,inj,Quad PF,6+ Mos 08/30/2014, 09/26/2019   Influenza-Unspecified  09/15/2016, 09/23/2017, 10/13/2018   PFIZER(Purple Top)SARS-COV-2 Vaccination 12/27/2019, 01/17/2020, 10/21/2020   Pneumococcal Conjugate-13 08/30/2014   Pneumococcal Polysaccharide-23 06/27/2013, 06/27/2014   Tdap 04/04/2012   Zoster Recombinat (Shingrix) 05/21/2021   Zoster, Live 10/27/2011    Health Maintenance  Topic Date Due   DEXA SCAN  10/19/2020   COVID-19 Vaccine (4 - Booster for Pfizer series) 12/16/2020   Zoster Vaccines- Shingrix (2 of 2) 07/16/2021   TETANUS/TDAP  04/04/2022   INFLUENZA VACCINE  07/20/2022   Pneumonia Vaccine 58+ Years old  Completed   Hepatitis C Screening  Completed   HPV VACCINES  Aged Out     Discussed health benefits of physical activity, and encouraged her to engage in regular exercise appropriate for her age and condition.    ***  No follow-ups on file.     {provider attestation***:1}   Lavon Paganini, MD  [facility] [phone] (phone) [phone] (fax)  [place]

## 2022-05-11 ENCOUNTER — Encounter: Payer: Self-pay | Admitting: Family Medicine

## 2022-05-11 ENCOUNTER — Ambulatory Visit (INDEPENDENT_AMBULATORY_CARE_PROVIDER_SITE_OTHER): Payer: Medicare PPO | Admitting: Family Medicine

## 2022-05-11 VITALS — BP 125/77 | HR 65 | Temp 98.0°F | Resp 16 | Ht 65.0 in | Wt 170.0 lb

## 2022-05-11 DIAGNOSIS — R739 Hyperglycemia, unspecified: Secondary | ICD-10-CM | POA: Diagnosis not present

## 2022-05-11 DIAGNOSIS — E663 Overweight: Secondary | ICD-10-CM | POA: Diagnosis not present

## 2022-05-11 DIAGNOSIS — E7800 Pure hypercholesterolemia, unspecified: Secondary | ICD-10-CM

## 2022-05-11 DIAGNOSIS — E559 Vitamin D deficiency, unspecified: Secondary | ICD-10-CM | POA: Diagnosis not present

## 2022-05-11 DIAGNOSIS — Z0000 Encounter for general adult medical examination without abnormal findings: Secondary | ICD-10-CM

## 2022-05-11 NOTE — Assessment & Plan Note (Signed)
Discussed importance of healthy weight management Discussed diet and exercise  

## 2022-05-11 NOTE — Patient Instructions (Addendum)
Consider asking at the pharmacy about the tetanus shot (TDAP) ?

## 2022-05-11 NOTE — Assessment & Plan Note (Signed)
Previously well controlled Continue statin Repeat FLP and CMP  

## 2022-05-12 LAB — COMPREHENSIVE METABOLIC PANEL
ALT: 11 IU/L (ref 0–32)
AST: 17 IU/L (ref 0–40)
Albumin/Globulin Ratio: 1.7 (ref 1.2–2.2)
Albumin: 4.6 g/dL (ref 3.7–4.7)
Alkaline Phosphatase: 81 IU/L (ref 44–121)
BUN/Creatinine Ratio: 17 (ref 12–28)
BUN: 13 mg/dL (ref 8–27)
Bilirubin Total: 0.4 mg/dL (ref 0.0–1.2)
CO2: 22 mmol/L (ref 20–29)
Calcium: 9.8 mg/dL (ref 8.7–10.3)
Chloride: 104 mmol/L (ref 96–106)
Creatinine, Ser: 0.76 mg/dL (ref 0.57–1.00)
Globulin, Total: 2.7 g/dL (ref 1.5–4.5)
Glucose: 107 mg/dL — ABNORMAL HIGH (ref 70–99)
Potassium: 4.4 mmol/L (ref 3.5–5.2)
Sodium: 142 mmol/L (ref 134–144)
Total Protein: 7.3 g/dL (ref 6.0–8.5)
eGFR: 80 mL/min/{1.73_m2} (ref >59)

## 2022-05-12 LAB — LIPID PANEL WITH LDL/HDL RATIO
Cholesterol, Total: 202 mg/dL — ABNORMAL HIGH (ref 100–199)
HDL: 74 mg/dL (ref >39)
LDL Chol Calc (NIH): 102 mg/dL — ABNORMAL HIGH (ref 0–99)
LDL/HDL Ratio: 1.4 ratio (ref 0.0–3.2)
Triglycerides: 151 mg/dL — ABNORMAL HIGH (ref 0–149)
VLDL Cholesterol Cal: 26 mg/dL (ref 5–40)

## 2022-05-12 LAB — HEMOGLOBIN A1C
Est. average glucose Bld gHb Est-mCnc: 108 mg/dL
Hgb A1c MFr Bld: 5.4 % (ref 4.8–5.6)

## 2022-05-12 LAB — VITAMIN D 25 HYDROXY (VIT D DEFICIENCY, FRACTURES): Vit D, 25-Hydroxy: 54.8 ng/mL (ref 30.0–100.0)

## 2022-06-26 ENCOUNTER — Other Ambulatory Visit: Payer: Self-pay | Admitting: Family Medicine

## 2022-08-10 ENCOUNTER — Other Ambulatory Visit: Payer: Self-pay | Admitting: Family Medicine

## 2022-08-10 DIAGNOSIS — Z780 Asymptomatic menopausal state: Secondary | ICD-10-CM

## 2022-09-20 ENCOUNTER — Other Ambulatory Visit: Payer: Self-pay | Admitting: Family Medicine

## 2022-11-01 DIAGNOSIS — H353132 Nonexudative age-related macular degeneration, bilateral, intermediate dry stage: Secondary | ICD-10-CM | POA: Diagnosis not present

## 2022-11-04 DIAGNOSIS — H6123 Impacted cerumen, bilateral: Secondary | ICD-10-CM | POA: Diagnosis not present

## 2022-11-04 DIAGNOSIS — H902 Conductive hearing loss, unspecified: Secondary | ICD-10-CM | POA: Diagnosis not present

## 2023-03-28 ENCOUNTER — Other Ambulatory Visit: Payer: Self-pay | Admitting: Family Medicine

## 2023-03-29 NOTE — Telephone Encounter (Signed)
Requested Prescriptions  Pending Prescriptions Disp Refills   atorvastatin (LIPITOR) 10 MG tablet [Pharmacy Med Name: ATORVASTATIN CALCIUM 10 MG TAB] 90 tablet 2    Sig: TAKE 1 TABLET BY MOUTH DAILY     Cardiovascular:  Antilipid - Statins Failed - 03/28/2023 12:43 PM      Failed - Lipid Panel in normal range within the last 12 months    Cholesterol, Total  Date Value Ref Range Status  05/11/2022 202 (H) 100 - 199 mg/dL Final   LDL Chol Calc (NIH)  Date Value Ref Range Status  05/11/2022 102 (H) 0 - 99 mg/dL Final   HDL  Date Value Ref Range Status  05/11/2022 74 >39 mg/dL Final   Triglycerides  Date Value Ref Range Status  05/11/2022 151 (H) 0 - 149 mg/dL Final         Passed - Patient is not pregnant      Passed - Valid encounter within last 12 months    Recent Outpatient Visits           10 months ago Encounter for annual wellness visit (AWV) in Medicare patient   [place] [facility] [place], Marzella Schlein, MD   1 year ago Encounter for annual wellness visit (AWV) in Medicare patient   [place] [facility] [place], Marzella Schlein, MD   2 years ago Contact dermatitis due to other agent, unspecified contact dermatitis type   [place] [facility] [place], Eula Fried, FNP   3 years ago Encounter for annual physical exam   [place] [facility] [place], Marzella Schlein, MD   4 years ago Encounter for annual physical exam   [facility] [place], Marzella Schlein, MD

## 2023-05-09 DIAGNOSIS — H35372 Puckering of macula, left eye: Secondary | ICD-10-CM | POA: Diagnosis not present

## 2023-05-09 DIAGNOSIS — H04123 Dry eye syndrome of bilateral lacrimal glands: Secondary | ICD-10-CM | POA: Diagnosis not present

## 2023-05-09 DIAGNOSIS — Z961 Presence of intraocular lens: Secondary | ICD-10-CM | POA: Diagnosis not present

## 2023-05-09 DIAGNOSIS — H353211 Exudative age-related macular degeneration, right eye, with active choroidal neovascularization: Secondary | ICD-10-CM | POA: Diagnosis not present

## 2023-05-11 DIAGNOSIS — M3500 Sicca syndrome, unspecified: Secondary | ICD-10-CM | POA: Diagnosis not present

## 2023-05-11 DIAGNOSIS — I10 Essential (primary) hypertension: Secondary | ICD-10-CM | POA: Diagnosis not present

## 2023-05-11 DIAGNOSIS — N951 Menopausal and female climacteric states: Secondary | ICD-10-CM | POA: Diagnosis not present

## 2023-05-11 DIAGNOSIS — J309 Allergic rhinitis, unspecified: Secondary | ICD-10-CM | POA: Diagnosis not present

## 2023-05-11 DIAGNOSIS — M8580 Other specified disorders of bone density and structure, unspecified site: Secondary | ICD-10-CM | POA: Diagnosis not present

## 2023-05-11 DIAGNOSIS — H3530 Unspecified macular degeneration: Secondary | ICD-10-CM | POA: Diagnosis not present

## 2023-05-11 DIAGNOSIS — R739 Hyperglycemia, unspecified: Secondary | ICD-10-CM | POA: Diagnosis not present

## 2023-05-11 DIAGNOSIS — R32 Unspecified urinary incontinence: Secondary | ICD-10-CM | POA: Diagnosis not present

## 2023-05-11 DIAGNOSIS — E785 Hyperlipidemia, unspecified: Secondary | ICD-10-CM | POA: Diagnosis not present

## 2023-05-12 NOTE — Progress Notes (Signed)
I,Joseline E Rosas,acting as a scribe for [name], MD.,have documented all relevant documentation on the behalf of [name], MD,as directed by  [name], MD while in the presence of [name], MD.   Annual Wellness Visit     Patient: Carol Kelly, Female    DOB: [birth year], 82 y.o.   MRN: [id number] Visit Date: 05/13/2023  Today's Provider: [name], MD   Chief Complaint  Patient presents with   Annual Exam   Subjective    Carol Kelly is a 82 y.o. female who presents today for her Annual Wellness Visit.   She reports consuming a general diet. The patient does not participate in regular exercise at present. She generally feels well. She reports sleeping well. She does not have additional problems to discuss today.   HPI Covid Vaccine: Declined  Medications: Outpatient Medications Prior to Visit  Medication Sig   acetaminophen (TYLENOL) 500 MG tablet Take 500 mg by mouth every 6 (six) hours as needed.   atorvastatin (LIPITOR) 10 MG tablet TAKE 1 TABLET BY MOUTH DAILY   Calcium Carb-Cholecalciferol 600-800 MG-UNIT CHEW Chew by mouth 2 (two) times daily.   cetirizine (ZYRTEC) 10 MG tablet Take 10 mg by mouth daily.   cycloSPORINE (RESTASIS) 0.05 % ophthalmic emulsion Place 1 drop into both eyes 2 (two) times daily.   estradiol (ESTRACE) 0.5 MG tablet TAKE ONE TABLET EVERY DAY   Multiple Vitamin (MULTIVITAMIN) capsule Take 1 capsule by mouth daily.   Multiple Vitamins-Minerals (PRESERVISION AREDS 2 PO) Take by mouth 2 (two) times daily.    No facility-administered medications prior to visit.    Allergies  Allergen Reactions   Achromycin [Tetracycline] Hives   Neosporin  [Neomycin-Bacitracin Zn-Polymyx]     Other reaction(s): Urticaria   Aspirin Rash    Patient Care Team: Erasmo Downer, MD as PCP - General (Family Medicine) Lockie Mola, MD as Referring Physician (Ophthalmology) Linus Salmons,  MD as Consulting Physician (Otolaryngology)   Review of Systems  All other systems reviewed and are negative.       Objective    Vitals: BP 138/83 (BP Location: Left Arm, Patient Position: Sitting, Cuff Size: Large)   Pulse 82   Temp 97.7 F (36.5 C) (Oral)   Resp 16   Ht 5\' 5"  (1.651 m)   Wt 171 lb (77.6 kg)   BMI 28.46 kg/m     Physical Exam Vitals reviewed.  Constitutional:      General: She is not in acute distress.    Appearance: Normal appearance. She is well-developed. She is not diaphoretic.  HENT:     Head: Normocephalic and atraumatic.     Right Ear: Tympanic membrane, ear canal and external ear normal.     Left Ear: Tympanic membrane, ear canal and external ear normal.     Nose: Nose normal.     Mouth/Throat:     Mouth: Mucous membranes are moist.     Pharynx: Oropharynx is clear. No oropharyngeal exudate.  Eyes:     General: No scleral icterus.    Conjunctiva/sclera: Conjunctivae normal.     Pupils: Pupils are equal, round, and reactive to light.  Neck:     Thyroid: No thyromegaly.  Cardiovascular:     Rate and Rhythm: Normal rate and regular rhythm.     Pulses: Normal pulses.     Heart sounds: Normal heart sounds. No murmur heard. Pulmonary:     Effort: Pulmonary effort is normal. No respiratory distress.  Breath sounds: Normal breath sounds. No wheezing or rales.  Abdominal:     General: There is no distension.     Palpations: Abdomen is soft.     Tenderness: There is no abdominal tenderness.  Musculoskeletal:        General: No deformity.     Cervical back: Neck supple.     Right lower leg: No edema.     Left lower leg: No edema.  Lymphadenopathy:     Cervical: No cervical adenopathy.  Skin:    General: Skin is warm and dry.     Findings: No rash.  Neurological:     Mental Status: She is alert and oriented to person, place, and time. Mental status is at baseline.     Gait: Gait normal.  Psychiatric:        Mood and Affect: Mood  normal.        Behavior: Behavior normal.        Thought Content: Thought content normal.     Most recent functional status assessment:    05/13/2023    9:37 AM  In your present state of health, do you have any difficulty performing the following activities:  Hearing? 0  Vision? 0  Difficulty concentrating or making decisions? 0  Walking or climbing stairs? 0  Dressing or bathing? 0  Doing errands, shopping? 0   Most recent fall risk assessment:    05/13/2023    9:37 AM  Fall Risk   Falls in the past year? 0  Number falls in past yr: 0  Injury with Fall? 0  Risk for fall due to : No Fall Risks    Most recent depression screenings:    05/13/2023    9:37 AM 05/11/2022    9:58 AM  PHQ 2/9 Scores  PHQ - 2 Score 0 0  PHQ- 9 Score  0   Most recent cognitive screening:    05/11/2022    9:59 AM  6CIT Screen  What Year? 0 points  What month? 0 points  What time? 0 points  Count back from 20 0 points  Months in reverse 0 points  Repeat phrase 4 points  Total Score 4 points   Most recent Audit-C alcohol use screening    05/13/2023    9:37 AM  Alcohol Use Disorder Test (AUDIT)  1. How often do you have a drink containing alcohol? 0  2. How many drinks containing alcohol do you have on a typical day when you are drinking? 0  3. How often do you have six or more drinks on one occasion? 0  AUDIT-C Score 0   A score of 3 or more in women, and 4 or more in men indicates increased risk for alcohol abuse, EXCEPT if all of the points are from question 1   No results found for any visits on 05/13/23.  Assessment & Plan     Annual wellness visit done today including the all of the following: Reviewed patient's Family Medical History Reviewed and updated list of patient's medical providers Assessment of cognitive impairment was done Assessed patient's functional ability Established a written schedule for health screening services Health Risk Assessent Completed and  Reviewed  Exercise Activities and Dietary recommendations  Goals      Exercise 150 minutes per week (moderate activity)     Pt to start back walking 5 days a week for 30 minutes at a time.      Reduce portion size  Starting 10/01/16, I will cut back on my portion sizes and fatty foods.        Immunization History  Administered Date(s) Administered   Influenza, High Dose Seasonal PF 10/13/2020, 09/29/2021   Influenza,inj,Quad PF,6+ Mos 08/30/2014, 09/26/2019   Influenza-Unspecified 09/15/2016, 09/23/2017, 10/13/2018   PFIZER(Purple Top)SARS-COV-2 Vaccination 12/27/2019, 01/17/2020, 10/21/2020   Pneumococcal Conjugate-13 08/30/2014   Pneumococcal Polysaccharide-23 06/27/2013, 06/27/2014   Tdap 04/04/2012, 05/13/2022   Zoster Recombinat (Shingrix) 05/21/2021, 08/25/2021   Zoster, Live 10/27/2011    Health Maintenance  Topic Date Due   COVID-19 Vaccine (4 - 2023-24 season) 08/20/2022   INFLUENZA VACCINE  07/21/2023   Medicare Annual Wellness (AWV)  05/12/2024   DTaP/Tdap/Td (3 - Td or Tdap) 05/13/2032   Pneumonia Vaccine 57+ Years old  Completed   DEXA SCAN  Completed   Zoster Vaccines- Shingrix  Completed   HPV VACCINES  Aged Out   Hepatitis C Screening  Discontinued     Discussed health benefits of physical activity, and encouraged her to engage in regular exercise appropriate for her age and condition.    Problem List Items Addressed This Visit       Musculoskeletal and Integument   Osteopenia    Declines repeat DEXA Continue Vit D      Relevant Orders   VITAMIN D 25 Hydroxy (Vit-D Deficiency, Fractures)     Other   Overweight    Discussed importance of healthy weight management Discussed diet and exercise       Hypercholesteremia    Previously well controlled Continue statin Repeat FLP and CMP      Relevant Orders   Comprehensive metabolic panel   Lipid panel   Avitaminosis D    Continue supplement Recheck level       Relevant Orders    VITAMIN D 25 Hydroxy (Vit-D Deficiency, Fractures)   History of anemia   Relevant Orders   CBC   Macular degeneration    Worsened from dry to wet in R eye Getting shots starting in 2 wks      Other Visit Diagnoses     Encounter for annual wellness visit (AWV) in Medicare patient    -  Primary   Encounter for annual physical exam       Relevant Orders   Comprehensive metabolic panel   Lipid panel   CBC   VITAMIN D 25 Hydroxy (Vit-D Deficiency, Fractures)   Hemoglobin A1c   Hyperglycemia       Relevant Orders   Hemoglobin A1c        Return in about 1 year (around 05/12/2024) for CPE, AWV.     I, [name], MD, have reviewed all documentation for this visit. The documentation on 05/13/23 for the exam, diagnosis, procedures, and orders are all accurate and complete.   Elzora Cullins, Marzella Schlein, MD, MPH [facility]

## 2023-05-13 ENCOUNTER — Encounter: Payer: Self-pay | Admitting: Family Medicine

## 2023-05-13 ENCOUNTER — Ambulatory Visit (INDEPENDENT_AMBULATORY_CARE_PROVIDER_SITE_OTHER): Payer: Medicare PPO | Admitting: Family Medicine

## 2023-05-13 VITALS — BP 138/83 | HR 82 | Temp 97.7°F | Resp 16 | Ht 65.0 in | Wt 171.0 lb

## 2023-05-13 DIAGNOSIS — Z862 Personal history of diseases of the blood and blood-forming organs and certain disorders involving the immune mechanism: Secondary | ICD-10-CM

## 2023-05-13 DIAGNOSIS — E7800 Pure hypercholesterolemia, unspecified: Secondary | ICD-10-CM

## 2023-05-13 DIAGNOSIS — E559 Vitamin D deficiency, unspecified: Secondary | ICD-10-CM

## 2023-05-13 DIAGNOSIS — M8589 Other specified disorders of bone density and structure, multiple sites: Secondary | ICD-10-CM | POA: Diagnosis not present

## 2023-05-13 DIAGNOSIS — R739 Hyperglycemia, unspecified: Secondary | ICD-10-CM | POA: Diagnosis not present

## 2023-05-13 DIAGNOSIS — Z0000 Encounter for general adult medical examination without abnormal findings: Secondary | ICD-10-CM | POA: Diagnosis not present

## 2023-05-13 DIAGNOSIS — E663 Overweight: Secondary | ICD-10-CM

## 2023-05-13 DIAGNOSIS — H3530 Unspecified macular degeneration: Secondary | ICD-10-CM | POA: Insufficient documentation

## 2023-05-13 NOTE — Assessment & Plan Note (Signed)
Worsened from dry to wet in R eye Getting shots starting in 2 wks

## 2023-05-13 NOTE — Assessment & Plan Note (Signed)
Discussed importance of healthy weight management Discussed diet and exercise  

## 2023-05-13 NOTE — Assessment & Plan Note (Signed)
Continue supplement Recheck level 

## 2023-05-13 NOTE — Assessment & Plan Note (Signed)
Declines repeat DEXA Continue Vit D

## 2023-05-13 NOTE — Assessment & Plan Note (Signed)
Previously well controlled Continue statin Repeat FLP and CMP  

## 2023-05-14 LAB — COMPREHENSIVE METABOLIC PANEL
ALT: 13 IU/L (ref 0–32)
AST: 17 IU/L (ref 0–40)
Albumin/Globulin Ratio: 1.9 (ref 1.2–2.2)
Albumin: 4.5 g/dL (ref 3.8–4.8)
Alkaline Phosphatase: 84 IU/L (ref 44–121)
BUN/Creatinine Ratio: 21 (ref 12–28)
BUN: 19 mg/dL (ref 8–27)
Bilirubin Total: 0.5 mg/dL (ref 0.0–1.2)
CO2: 22 mmol/L (ref 20–29)
Calcium: 10.1 mg/dL (ref 8.7–10.3)
Chloride: 103 mmol/L (ref 96–106)
Creatinine, Ser: 0.92 mg/dL (ref 0.57–1.00)
Globulin, Total: 2.4 g/dL (ref 1.5–4.5)
Glucose: 109 mg/dL — ABNORMAL HIGH (ref 70–99)
Potassium: 5 mmol/L (ref 3.5–5.2)
Sodium: 141 mmol/L (ref 134–144)
Total Protein: 6.9 g/dL (ref 6.0–8.5)
eGFR: 63 mL/min/{1.73_m2} (ref >59)

## 2023-05-14 LAB — CBC
Hematocrit: 38.4 % (ref 34.0–46.6)
Hemoglobin: 12.6 g/dL (ref 11.1–15.9)
MCH: 31.7 pg (ref 26.6–33.0)
MCHC: 32.8 g/dL (ref 31.5–35.7)
MCV: 97 fL (ref 79–97)
Platelets: 298 10*3/uL (ref 150–450)
RBC: 3.98 x10E6/uL (ref 3.77–5.28)
RDW: 11.7 % (ref 11.7–15.4)
WBC: 7.9 10*3/uL (ref 3.4–10.8)

## 2023-05-14 LAB — VITAMIN D 25 HYDROXY (VIT D DEFICIENCY, FRACTURES): Vit D, 25-Hydroxy: 56.8 ng/mL (ref 30.0–100.0)

## 2023-05-14 LAB — HEMOGLOBIN A1C
Est. average glucose Bld gHb Est-mCnc: 117 mg/dL
Hgb A1c MFr Bld: 5.7 % — ABNORMAL HIGH (ref 4.8–5.6)

## 2023-05-14 LAB — LIPID PANEL
Chol/HDL Ratio: 3.1 ratio (ref 0.0–4.4)
Cholesterol, Total: 202 mg/dL — ABNORMAL HIGH (ref 100–199)
HDL: 65 mg/dL (ref >39)
LDL Chol Calc (NIH): 108 mg/dL — ABNORMAL HIGH (ref 0–99)
Triglycerides: 170 mg/dL — ABNORMAL HIGH (ref 0–149)
VLDL Cholesterol Cal: 29 mg/dL (ref 5–40)

## 2023-05-23 ENCOUNTER — Other Ambulatory Visit: Payer: Self-pay | Admitting: Family Medicine

## 2023-05-23 DIAGNOSIS — Z780 Asymptomatic menopausal state: Secondary | ICD-10-CM

## 2023-05-23 DIAGNOSIS — H353211 Exudative age-related macular degeneration, right eye, with active choroidal neovascularization: Secondary | ICD-10-CM | POA: Diagnosis not present

## 2023-05-23 DIAGNOSIS — Z961 Presence of intraocular lens: Secondary | ICD-10-CM | POA: Diagnosis not present

## 2023-05-23 DIAGNOSIS — H353132 Nonexudative age-related macular degeneration, bilateral, intermediate dry stage: Secondary | ICD-10-CM | POA: Diagnosis not present

## 2023-05-24 NOTE — Telephone Encounter (Signed)
Requested Prescriptions  Pending Prescriptions Disp Refills   estradiol (ESTRACE) 0.5 MG tablet [Pharmacy Med Name: ESTRADIOL 0.5 MG TAB] 90 tablet 3    Sig: TAKE 1 TABLET BY MOUTH DAILY     OB/GYN:  Estrogens Failed - 05/23/2023  9:39 AM      Failed - Mammogram is up-to-date per Health Maintenance      Passed - Last BP in normal range    BP Readings from Last 1 Encounters:  05/13/23 138/83         Passed - Valid encounter within last 12 months    Recent Outpatient Visits           1 week ago Encounter for annual wellness visit (AWV) in Medicare patient   [place] [facility] [place], Marzella Schlein, MD   1 year ago Encounter for annual wellness visit (AWV) in Medicare patient   [place] [facility] [place], Marzella Schlein, MD   2 years ago Encounter for annual wellness visit (AWV) in Medicare patient   [place] [facility] [place], Marzella Schlein, MD   2 years ago Contact dermatitis due to other agent, unspecified contact dermatitis type   [place] [facility] [place], Eula Fried, FNP   3 years ago Encounter for annual physical exam   [place] [facility] [place], Marzella Schlein, MD       Future Appointments             In 11 months Bacigalupo, Marzella Schlein, MD [facility]

## 2023-06-15 DIAGNOSIS — H902 Conductive hearing loss, unspecified: Secondary | ICD-10-CM | POA: Diagnosis not present

## 2023-06-15 DIAGNOSIS — H6123 Impacted cerumen, bilateral: Secondary | ICD-10-CM | POA: Diagnosis not present

## 2023-07-18 DIAGNOSIS — H353211 Exudative age-related macular degeneration, right eye, with active choroidal neovascularization: Secondary | ICD-10-CM | POA: Diagnosis not present

## 2023-09-05 DIAGNOSIS — H353211 Exudative age-related macular degeneration, right eye, with active choroidal neovascularization: Secondary | ICD-10-CM | POA: Diagnosis not present

## 2023-10-10 ENCOUNTER — Other Ambulatory Visit: Payer: Self-pay | Admitting: Family Medicine

## 2023-10-11 NOTE — Telephone Encounter (Signed)
Requested Prescriptions  Pending Prescriptions Disp Refills   atorvastatin (LIPITOR) 10 MG tablet [Pharmacy Med Name: ATORVASTATIN CALCIUM 10 MG TAB] 90 tablet 2    Sig: TAKE 1 TABLET BY MOUTH DAILY     Cardiovascular:  Antilipid - Statins Failed - 10/10/2023 12:03 PM      Failed - Lipid Panel in normal range within the last 12 months    Cholesterol, Total  Date Value Ref Range Status  05/13/2023 202 (H) 100 - 199 mg/dL Final   LDL Chol Calc (NIH)  Date Value Ref Range Status  05/13/2023 108 (H) 0 - 99 mg/dL Final   HDL  Date Value Ref Range Status  05/13/2023 65 >39 mg/dL Final   Triglycerides  Date Value Ref Range Status  05/13/2023 170 (H) 0 - 149 mg/dL Final         Passed - Patient is not pregnant      Passed - Valid encounter within last 12 months    Recent Outpatient Visits           5 months ago Encounter for annual wellness visit (AWV) in Medicare patient   [place] [facility] [place], Marzella Schlein, MD   1 year ago Encounter for annual wellness visit (AWV) in Medicare patient   [place] [facility] [place], Marzella Schlein, MD   2 years ago Encounter for annual wellness visit (AWV) in Medicare patient   [place] [facility] [place], Marzella Schlein, MD   3 years ago Contact dermatitis due to other agent, unspecified contact dermatitis type   [place] [facility] [place], Eula Fried, FNP   3 years ago Encounter for annual physical exam   [place] [facility] [place], Marzella Schlein, MD       Future Appointments             In 7 months Bacigalupo, Marzella Schlein, MD [facility], [facility]

## 2023-11-07 DIAGNOSIS — H902 Conductive hearing loss, unspecified: Secondary | ICD-10-CM | POA: Diagnosis not present

## 2023-11-07 DIAGNOSIS — H6123 Impacted cerumen, bilateral: Secondary | ICD-10-CM | POA: Diagnosis not present

## 2023-12-05 DIAGNOSIS — H353211 Exudative age-related macular degeneration, right eye, with active choroidal neovascularization: Secondary | ICD-10-CM | POA: Diagnosis not present

## 2024-01-09 DIAGNOSIS — H353211 Exudative age-related macular degeneration, right eye, with active choroidal neovascularization: Secondary | ICD-10-CM | POA: Diagnosis not present

## 2024-02-14 DIAGNOSIS — H353211 Exudative age-related macular degeneration, right eye, with active choroidal neovascularization: Secondary | ICD-10-CM | POA: Diagnosis not present

## 2024-02-21 ENCOUNTER — Other Ambulatory Visit: Payer: Self-pay | Admitting: Internal Medicine

## 2024-02-21 DIAGNOSIS — Z780 Asymptomatic menopausal state: Secondary | ICD-10-CM

## 2024-02-22 NOTE — Telephone Encounter (Signed)
 Requested Prescriptions  Pending Prescriptions Disp Refills   estradiol (ESTRACE) 0.5 MG tablet [Pharmacy Med Name: ESTRADIOL 0.5 MG TAB] 90 tablet 3    Sig: TAKE 1 TABLET BY MOUTH DAILY     OB/GYN:  Estrogens Failed - 02/22/2024 12:26 PM      Failed - Mammogram is up-to-date per Health Maintenance      Passed - Last BP in normal range    BP Readings from Last 1 Encounters:  05/13/23 138/83         Passed - Valid encounter within last 12 months    Recent Outpatient Visits           9 months ago Encounter for annual wellness visit (AWV) in Medicare patient   [place] [facility] [place], Marzella Schlein, MD   1 year ago Encounter for annual wellness visit (AWV) in Medicare patient   [place] [facility] [place], Marzella Schlein, MD   2 years ago Encounter for annual wellness visit (AWV) in Medicare patient   [place] [facility] [place], Marzella Schlein, MD   3 years ago Contact dermatitis due to other agent, unspecified contact dermatitis type   [place] [facility] [place], Eula Fried, FNP   4 years ago Encounter for annual physical exam   [place] [facility] [place], Marzella Schlein, MD       Future Appointments             In 2 months Bacigalupo, Marzella Schlein, MD [facility], [facility]

## 2024-03-20 ENCOUNTER — Telehealth: Payer: Self-pay

## 2024-03-20 NOTE — Telephone Encounter (Signed)
 Copied from CRM [id number]. Topic: Appointments - Appointment Info/Confirmation >> Mar 20, 2024  3:28 PM Truddie Crumble wrote: Patient/patient representative is calling for information regarding an appointment.   Patient called stating she is returning a call to the office but she does not know who called her

## 2024-05-09 ENCOUNTER — Encounter: Payer: Medicare PPO | Admitting: Family Medicine

## 2024-05-15 ENCOUNTER — Encounter: Payer: Self-pay | Admitting: Family Medicine

## 2024-05-15 ENCOUNTER — Ambulatory Visit: Payer: Self-pay | Admitting: Family Medicine

## 2024-05-15 VITALS — BP 135/70 | HR 78 | Ht 65.0 in | Wt 163.5 lb

## 2024-05-15 DIAGNOSIS — R7303 Prediabetes: Secondary | ICD-10-CM | POA: Diagnosis not present

## 2024-05-15 DIAGNOSIS — E7800 Pure hypercholesterolemia, unspecified: Secondary | ICD-10-CM

## 2024-05-15 DIAGNOSIS — M791 Myalgia, unspecified site: Secondary | ICD-10-CM | POA: Insufficient documentation

## 2024-05-15 DIAGNOSIS — H3530 Unspecified macular degeneration: Secondary | ICD-10-CM

## 2024-05-15 DIAGNOSIS — M8589 Other specified disorders of bone density and structure, multiple sites: Secondary | ICD-10-CM

## 2024-05-15 DIAGNOSIS — Z0001 Encounter for general adult medical examination with abnormal findings: Secondary | ICD-10-CM

## 2024-05-15 DIAGNOSIS — Z0000 Encounter for general adult medical examination without abnormal findings: Secondary | ICD-10-CM

## 2024-05-15 DIAGNOSIS — E559 Vitamin D deficiency, unspecified: Secondary | ICD-10-CM

## 2024-05-15 NOTE — Assessment & Plan Note (Signed)
 Continue vitamin D  supplementation -Recheck vitamin D  level

## 2024-05-15 NOTE — Assessment & Plan Note (Addendum)
 Continuing vitamin D  treatment. No recent fractures. -will check levels

## 2024-05-15 NOTE — Assessment & Plan Note (Signed)
 This has been previously well controlled but she has stopped her statin so we will see how her labs look and may introduce a new agent if her cholesterol is highly elevated. -Check Lipid Levels

## 2024-05-15 NOTE — Progress Notes (Unsigned)
 Annual Wellness Visit     Patient: Carol Kelly, Female    DOB: February 06, 1941, 83 y.o.   MRN: [id number]  Subjective  Chief Complaint  Patient presents with   Annual Exam    Last completed 05/13/23 Diet -  general, well balanced Exercise - none just stays busy Feeling - well Sleeping - well Concerns - none    Carol Kelly is a 83 y.o. female who presents today for a complete physical exam/. She is feeling very well today and has been spending time with her friends doing very complex, 1000+ piece puzzles. Her Wet AMD treatments have been doing very well for her and she is pleased with the results. She is hoping that she may be able to get off of them because she is doing so well. She did stop taking her statin about 6 months ago because it was causing her some severe thigh pain. She retried it for a few days after her pain went away but the pain then returned. She denies any unintentional weight loss, hematuria/hematochezia, fevers, chills, or any other symptoms. 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 05/15/2024   Prediabetes 05/15/2024   Macular degeneration 05/13/2023   History of anemia 01/07/2020   Allergic rhinitis 09/26/2015   Overweight 09/26/2015   DD (diverticular disease) 09/26/2015   Hot flash, menopausal 09/26/2015   Hypercholesteremia 09/26/2015   Osteopenia 09/26/2015   Acne erythematosa 09/26/2015   Avitaminosis D 09/26/2015   Past Medical History:  Diagnosis Date   Allergy 1965   When I moved to [facility]   Cataract    Past Surgical History:  Procedure Laterality Date   ABDOMINAL HYSTERECTOMY  1980   APPENDECTOMY  1970   Right ovary removed at the same time   BREAST BIOPSY Right 11/04/2014   neg   BREAST CYST ASPIRATION Right 11/04/2014   benign   DILATION AND CURETTAGE OF UTERUS     EYE SURGERY Bilateral left9/24/14    Cataract removed Right 07/18/2013   OVARIAN CYST REMOVAL  1969   SIGMOIDOSCOPY  1985      Medications: Outpatient Medications Prior to Visit  Medication Sig   acetaminophen (TYLENOL) 500 MG tablet Take 500 mg by mouth every 6 (six) hours as needed.   Calcium  Carb-Cholecalciferol 600-800 MG-UNIT CHEW Chew by mouth 2 (two) times daily.   cetirizine (ZYRTEC) 10 MG tablet Take 10 mg by mouth daily.   cycloSPORINE (RESTASIS) 0.05 % ophthalmic emulsion Place 1 drop into both eyes 2 (two) times daily.   estradiol  (ESTRACE ) 0.5 MG tablet TAKE 1 TABLET BY MOUTH DAILY   Multiple Vitamin (MULTIVITAMIN) capsule Take 1 capsule by mouth daily.   Multiple Vitamins-Minerals (PRESERVISION AREDS 2 PO) Take by mouth 2 (two) times daily.    [DISCONTINUED] atorvastatin  (LIPITOR) 10 MG tablet TAKE 1 TABLET BY MOUTH DAILY   No facility-administered medications prior to visit.    Allergies  Allergen Reactions   Achromycin [Tetracycline] Hives   Neosporin  [Neomycin-Bacitracin Zn-Polymyx]     Other reaction(s): Urticaria   Aspirin Rash    Patient Care Team: Mazie Speed, MD as PCP - General (Family Medicine) Annell Kidney, MD as Referring Physician (Ophthalmology) Lesly Raspberry, MD as Consulting Physician (Otolaryngology)  Review of Systems  Constitutional:  Negative for chills, diaphoresis, malaise/fatigue and weight loss.  HENT: Negative.    Eyes:  Negative for pain, discharge and redness.  Respiratory: Negative.    Cardiovascular:  Negative for chest pain and palpitations.  Gastrointestinal:  Negative for blood in stool, constipation and diarrhea.  Genitourinary:  Negative for dysuria and hematuria.  Musculoskeletal: Negative.   Skin: Negative.   Neurological: Negative.   Psychiatric/Behavioral:  Negative for depression.         Objective  BP 135/70 (Cuff Size: Normal)   Pulse 78   Ht 5\' 5"  (1.651 m)   Wt 163 lb 8 oz (74.2 kg)   SpO2 100%   BMI 27.21 kg/m  BP  Readings from Last 3 Encounters:  05/15/24 135/70  05/13/23 138/83  05/11/22 125/77   Wt Readings from Last 3 Encounters:  05/15/24 163 lb 8 oz (74.2 kg)  05/13/23 171 lb (77.6 kg)  05/11/22 170 lb (77.1 kg)      Physical Exam Constitutional:      General: She is not in acute distress.    Appearance: Normal appearance. She is normal weight.  HENT:     Head: Normocephalic and atraumatic.     Right Ear: External ear normal.     Left Ear: External ear normal.     Nose: Nose normal.     Mouth/Throat:     Mouth: Mucous membranes are moist.     Pharynx: No oropharyngeal exudate.  Eyes:     General: No scleral icterus.       Right eye: No discharge.        Left eye: No discharge.     Extraocular Movements: Extraocular movements intact.     Conjunctiva/sclera: Conjunctivae normal.     Pupils: Pupils are equal, round, and reactive to light.  Cardiovascular:     Rate and Rhythm: Normal rate and regular rhythm.     Pulses: Normal pulses.     Heart sounds: No murmur heard.    No friction rub. No gallop.  Pulmonary:     Effort: Pulmonary effort is normal. No respiratory distress.     Breath sounds: No wheezing.  Abdominal:     General: Abdomen is flat. There is no distension.     Palpations: Abdomen is soft.  Musculoskeletal:        General: No swelling. Normal range of motion.     Cervical back: Normal range of motion.  Skin:    General: Skin is warm.     Coloration: Skin is not jaundiced or pale.     Findings: No bruising or erythema.  Neurological:     General: No focal deficit present.     Mental Status: She is alert. Mental status is at baseline.     Cranial Nerves: No cranial nerve deficit.     Motor: No weakness.  Psychiatric:        Mood and Affect: Mood normal.        Behavior: Behavior normal.     Most recent functional status assessment:    05/15/2024    9:26 AM  In your present state of health, do you have any difficulty performing the following  activities:  Hearing? 0  Vision? 0  Difficulty concentrating or making decisions? 0  Walking or climbing stairs? 0  Dressing or bathing? 0  Doing errands, shopping? 0  Preparing Food and eating ? N  Using the Toilet? N  In the past six months, have you accidently leaked urine? Y  Do you have problems with loss of bowel control? N  Managing your Medications? N  Managing your Finances? N  Housekeeping or managing your Housekeeping? N   Most recent fall risk assessment:    05/15/2024    9:28 AM  Fall Risk   Falls in the past year? 0  Number falls in past yr: 0  Injury with Fall? 0  Risk for fall due to : No Fall Risks  Follow up Falls evaluation completed    Most recent depression screenings:    05/15/2024    9:29 AM 05/13/2023    9:37 AM  PHQ 2/9 Scores  PHQ - 2 Score 0 0   Most recent cognitive screening:    05/15/2024    9:29 AM  6CIT Screen  What Year? 0 points  What month? 0 points  What time? 0 points  Count back from 20 0 points  Months in reverse 0 points  Repeat phrase 0 points  Total Score 0 points   Most recent Audit-C alcohol use screening    05/11/2024    4:50 PM  Alcohol Use Disorder Test (AUDIT)  1. How often do you have a drink containing alcohol? 0  3. How often do you have six or more drinks on one occasion? 0   A score of 3 or more in women, and 4 or more in men indicates increased risk for alcohol abuse, EXCEPT if all of the points are from question 1   Vision/Hearing Screen: No results found.  Last metabolic panel Lab Results  Component Value Date   GLUCOSE 109 (H) 05/13/2023   NA 141 05/13/2023   K 5.0 05/13/2023   CL 103 05/13/2023   CO2 22 05/13/2023   BUN 19 05/13/2023   CREATININE 0.92 05/13/2023   EGFR 63 05/13/2023   CALCIUM  10.1 05/13/2023   PROT 6.9 05/13/2023   ALBUMIN 4.5 05/13/2023   LABGLOB 2.4 05/13/2023   AGRATIO 1.9 05/13/2023   BILITOT 0.5 05/13/2023   ALKPHOS 84 05/13/2023   AST 17 05/13/2023   ALT 13  05/13/2023   Last lipids Lab Results  Component Value Date   CHOL 202 (H) 05/13/2023   HDL 65 05/13/2023   LDLCALC 108 (H) 05/13/2023   TRIG 170 (H) 05/13/2023   CHOLHDL 3.1 05/13/2023   Last hemoglobin A1c Lab Results  Component Value Date   HGBA1C 5.7 (H) 05/13/2023   Last vitamin D  Lab Results  Component Value Date   VD25OH 56.8 05/13/2023      No results found for any visits on 05/15/24.    Assessment & Plan   Annual wellness visit done today including the all of the following: Reviewed patient's Family Medical History Reviewed and updated list of patient's medical providers Assessment of cognitive impairment was done Assessed patient's functional ability Established a written schedule for health screening services Health Risk Assessent Completed and Reviewed  Exercise Activities and Dietary recommendations  Goals      Exercise 150 minutes per week (moderate activity)     Pt to start back walking 5 days a week for 30 minutes at a time.      Reduce portion size     Starting 10/01/16, I will cut back on my portion sizes and fatty foods.        Immunization History  Administered Date(s) Administered   Influenza, High Dose Seasonal PF 10/13/2020, 09/29/2021   Influenza,inj,Quad PF,6+ Mos 08/30/2014, 09/26/2019   Influenza-Unspecified 09/15/2016, 09/23/2017, 10/13/2018   PFIZER(Purple Top)SARS-COV-2 Vaccination 12/27/2019, 01/17/2020, 10/21/2020   Pneumococcal Conjugate-13 08/30/2014   Pneumococcal Polysaccharide-23 06/27/2013, 06/27/2014  Tdap 04/04/2012, 05/13/2022   Zoster Recombinant(Shingrix) 05/21/2021, 08/25/2021   Zoster, Live 10/27/2011    Health Maintenance  Topic Date Due   COVID-19 Vaccine (5 - 2024-25 season) 08/21/2023   INFLUENZA VACCINE  07/20/2024   Medicare Annual Wellness (AWV)  05/15/2025   DTaP/Tdap/Td (3 - Td or Tdap) 05/13/2032   Pneumonia Vaccine 69+ Years old  Completed   DEXA SCAN  Completed   Zoster Vaccines- Shingrix   Completed   HPV VACCINES  Aged Out   Meningococcal B Vaccine  Aged Out   Hepatitis C Screening  Discontinued    Discussed health benefits of physical activity, and encouraged her to engage in regular exercise appropriate for her age and condition.    Problem List Items Addressed This Visit       Musculoskeletal and Integument   Osteopenia   Continuing vitamin D  treatment. No recent fractures. -will check levels      Relevant Orders   Vitamin D  (25 hydroxy)     Other   Hypercholesteremia   This has been previously well controlled but she has stopped her statin so we will see how her labs look and may introduce a new agent if her cholesterol is highly elevated. -Check Lipid Levels       Relevant Orders   Lipid Panel With LDL/HDL Ratio   Comprehensive metabolic panel with GFR   Avitaminosis D   Continue vitamin D  supplementation -Recheck vitamin D  level       Relevant Orders   Vitamin D  (25 hydroxy)   Macular degeneration   Patient has been receiving her injections for her Wet AMD and they have been doing very well for her. She has her next one in a few weeks. -Continue to follow up with optho       Myalgia due to statin   Sahalie stopped taking statin about 6 months ago because it was causing her some severe thigh pain. She retried it for a few days after her pain went away but the pain then returned. It was significantly impacting her quality of life so we will see how her labs are now and possibly start an alternate therapy if they are significantly elevated. -Check lipid panel      Prediabetes   A1c was elevated to prediabetic range last year at 5.7. She has been eating her normal diet and feels she has lost a few pounds since last year. -recheck A1c      Relevant Orders   HgB A1c   Other Visit Diagnoses       Encounter for annual wellness visit (AWV) in Medicare patient    -  Primary     Encounter for annual physical exam           No follow-ups on file.      Monda Angry, Medical Student

## 2024-05-15 NOTE — Assessment & Plan Note (Signed)
 A1c was elevated to prediabetic range last year at 5.7. She has been eating her normal diet and feels she has lost a few pounds since last year. -recheck A1c

## 2024-05-15 NOTE — Assessment & Plan Note (Signed)
 Patient has been receiving her injections for her Wet AMD and they have been doing very well for her. She has her next one in a few weeks. -Continue to follow up with optho

## 2024-05-15 NOTE — Assessment & Plan Note (Signed)
 Carol Kelly stopped taking statin about 6 months ago because it was causing her some severe thigh pain. She retried it for a few days after her pain went away but the pain then returned. It was significantly impacting her quality of life so we will see how her labs are now and possibly start an alternate therapy if they are significantly elevated. -Check lipid panel

## 2024-05-16 LAB — LIPID PANEL WITH LDL/HDL RATIO
Cholesterol, Total: 270 mg/dL — ABNORMAL HIGH (ref 100–199)
HDL: 66 mg/dL (ref >39)
LDL Chol Calc (NIH): 160 mg/dL — ABNORMAL HIGH (ref 0–99)
LDL/HDL Ratio: 2.4 ratio (ref 0.0–3.2)
Triglycerides: 240 mg/dL — ABNORMAL HIGH (ref 0–149)
VLDL Cholesterol Cal: 44 mg/dL — ABNORMAL HIGH (ref 5–40)

## 2024-05-16 LAB — COMPREHENSIVE METABOLIC PANEL WITH GFR
ALT: 8 [IU]/L (ref 0–32)
AST: 16 [IU]/L (ref 0–40)
Albumin: 4.4 g/dL (ref 3.7–4.7)
Alkaline Phosphatase: 91 [IU]/L (ref 44–121)
BUN/Creatinine Ratio: 16 (ref 12–28)
BUN: 13 mg/dL (ref 8–27)
Bilirubin Total: 0.3 mg/dL (ref 0.0–1.2)
CO2: 23 mmol/L (ref 20–29)
Calcium: 9.8 mg/dL (ref 8.7–10.3)
Chloride: 101 mmol/L (ref 96–106)
Creatinine, Ser: 0.8 mg/dL (ref 0.57–1.00)
Globulin, Total: 2.6 g/dL (ref 1.5–4.5)
Glucose: 105 mg/dL — ABNORMAL HIGH (ref 70–99)
Potassium: 4.5 mmol/L (ref 3.5–5.2)
Sodium: 140 mmol/L (ref 134–144)
Total Protein: 7 g/dL (ref 6.0–8.5)
eGFR: 74 mL/min/{1.73_m2}

## 2024-05-16 LAB — HEMOGLOBIN A1C
Est. average glucose Bld gHb Est-mCnc: 105 mg/dL
Hgb A1c MFr Bld: 5.3 % (ref 4.8–5.6)

## 2024-05-16 LAB — VITAMIN D 25 HYDROXY (VIT D DEFICIENCY, FRACTURES): Vit D, 25-Hydroxy: 54 ng/mL (ref 30.0–100.0)

## 2024-05-17 ENCOUNTER — Ambulatory Visit: Payer: Self-pay | Admitting: Family Medicine

## 2024-05-21 ENCOUNTER — Other Ambulatory Visit: Payer: Self-pay | Admitting: Family Medicine

## 2024-05-21 DIAGNOSIS — Z780 Asymptomatic menopausal state: Secondary | ICD-10-CM

## 2024-07-06 ENCOUNTER — Ambulatory Visit

## 2024-08-21 DIAGNOSIS — H353211 Exudative age-related macular degeneration, right eye, with active choroidal neovascularization: Secondary | ICD-10-CM | POA: Diagnosis not present

## 2024-09-04 ENCOUNTER — Ambulatory Visit: Admitting: Family Medicine

## 2024-09-04 ENCOUNTER — Encounter: Payer: Self-pay | Admitting: Family Medicine

## 2024-09-04 VITALS — BP 146/77 | HR 74 | Ht 65.0 in | Wt 164.9 lb

## 2024-09-04 DIAGNOSIS — N952 Postmenopausal atrophic vaginitis: Secondary | ICD-10-CM

## 2024-09-04 MED ORDER — ESTRADIOL 0.1 MG/GM VA CREA: 1.0000 | TOPICAL_CREAM | VAGINAL | 12 refills | Status: AC

## 2024-09-04 NOTE — Progress Notes (Signed)
 Acute visit   Patient: Carol Kelly   DOB: 03/08/1941   83 y.o. Female  MRN: [id number] PCP: Myrla Jon HERO, MD   Chief Complaint  Patient presents with   Vaginal Concern    Patient reports vaginal redness X 1 month. She reports no itching or pain associated. She reports washing twice daily with wet rag and has not gotten any better. She reports trying vagisal but it seemed to make things worse so she stopped   Subjective    Discussed the use of AI scribe software for clinical note transcription with the patient, who gave verbal consent to proceed.  History of Present Illness   Carol Kelly is an 83 year old female who presents with vaginal redness for about a month.  She has experienced vaginal redness for one month without pain, itching, or discharge. Washing the area twice daily with a wet washcloth has not improved the condition. Use of Vagisil worsened the redness, leading to its discontinuation. The redness is located inside the vaginal lips. She has a history of total hysterectomy and is currently taking oral estradiol . She has not used estradiol  cream. There is no vaginal dryness, and she has not been sexually active for over two years.        Review of Systems  Objective    BP (!) 146/77 (BP Location: Left Arm, Patient Position: Sitting, Cuff Size: Normal)   Pulse 74   Ht 5' 5 (1.651 m)   Wt 164 lb 14.4 oz (74.8 kg)   SpO2 100%   BMI 27.44 kg/m  Physical Exam Vitals reviewed.  Constitutional:      General: She is not in acute distress.    Appearance: Normal appearance. She is well-developed.  HENT:     Head: Normocephalic and atraumatic.  Eyes:     General: No scleral icterus.    Conjunctiva/sclera: Conjunctivae normal.  Cardiovascular:     Rate and Rhythm: Normal rate and regular rhythm.  Pulmonary:     Effort: Pulmonary effort is normal. No respiratory distress.  Genitourinary:    Comments: Erythematous vulva Skin:    General: Skin  is warm and dry.     Findings: No rash.  Neurological:     Mental Status: She is alert and oriented to person, place, and time. Mental status is at baseline.  Psychiatric:        Behavior: Behavior normal.       No results found for any visits on 09/04/24.  Assessment & Plan     Problem List Items Addressed This Visit   None Visit Diagnoses       Atrophic vaginitis    -  Primary          Atrophic vaginitis Chronic vaginal redness and irritation for about a month, located inside the labia, without pain, itching, or discharge. Likely due to tissue fragility post-menopause, exacerbated by lack of local estrogen. No signs of prolapse or tumor upon examination. Condition discussed as atrophic vaginitis, common in post-menopausal women due to decreased estrogen levels. - Prescribe vaginal estrogen cream to be applied three times weekly at bedtime. - Advise use of panties initially to manage potential discharge. - Instruct to apply Aquaphor or Vaseline if initial discomfort occurs. - Monitor for improvement over a few weeks and report if symptoms persist. - Provide a year's worth of refills for the cream.        Meds ordered this encounter  Medications  estradiol  (ESTRACE  VAGINAL) 0.1 MG/GM vaginal cream    Sig: Place 1 Applicatorful vaginally 3 (three) times a week.    Dispense:  42.5 g    Refill:  12     Return if symptoms worsen or fail to improve.      Jon Eva, MD  [facility] [phone] (phone) [phone] (fax)  [facility]

## 2024-10-03 ENCOUNTER — Ambulatory Visit

## 2024-11-05 DIAGNOSIS — H6123 Impacted cerumen, bilateral: Secondary | ICD-10-CM | POA: Diagnosis not present

## 2024-11-05 DIAGNOSIS — H900 Conductive hearing loss, bilateral: Secondary | ICD-10-CM | POA: Diagnosis not present

## 2024-11-13 DIAGNOSIS — H353211 Exudative age-related macular degeneration, right eye, with active choroidal neovascularization: Secondary | ICD-10-CM | POA: Diagnosis not present

## 2024-11-23 ENCOUNTER — Other Ambulatory Visit: Payer: Self-pay | Admitting: Family Medicine

## 2024-11-23 DIAGNOSIS — Z780 Asymptomatic menopausal state: Secondary | ICD-10-CM

## 2025-05-16 ENCOUNTER — Encounter: Admitting: Family Medicine
